# Patient Record
Sex: Male | Born: 1970 | Race: White | Hispanic: No | Smoking: Never smoker
Health system: Southern US, Community
[De-identification: ages and names within clinical notes are randomized; demographics above are authoritative.]

## PROBLEM LIST (undated history)

## (undated) DIAGNOSIS — N183 Chronic kidney disease, stage 3 unspecified: Secondary | ICD-10-CM

## (undated) DIAGNOSIS — M1030 Gout due to renal impairment, unspecified site: Secondary | ICD-10-CM

## (undated) DIAGNOSIS — E669 Obesity, unspecified: Secondary | ICD-10-CM

## (undated) DIAGNOSIS — I10 Essential (primary) hypertension: Secondary | ICD-10-CM

## (undated) DIAGNOSIS — R600 Localized edema: Secondary | ICD-10-CM

## (undated) HISTORY — PX: RENAL BIOPSY: SHX156

---

## 2004-01-20 ENCOUNTER — Ambulatory Visit (HOSPITAL_COMMUNITY): Admission: RE | Admit: 2004-01-20 | Discharge: 2004-01-20 | Payer: Self-pay

## 2004-06-03 ENCOUNTER — Ambulatory Visit: Payer: Self-pay | Admitting: Family Medicine

## 2006-01-11 ENCOUNTER — Ambulatory Visit: Payer: Self-pay | Admitting: Family Medicine

## 2006-01-17 ENCOUNTER — Ambulatory Visit: Payer: Self-pay | Admitting: Family Medicine

## 2006-02-22 ENCOUNTER — Ambulatory Visit: Payer: Self-pay | Admitting: Family Medicine

## 2006-06-16 ENCOUNTER — Ambulatory Visit: Payer: Self-pay | Admitting: Family Medicine

## 2012-02-21 ENCOUNTER — Other Ambulatory Visit: Payer: Self-pay | Admitting: Nephrology

## 2012-02-21 DIAGNOSIS — N059 Unspecified nephritic syndrome with unspecified morphologic changes: Secondary | ICD-10-CM

## 2012-02-22 ENCOUNTER — Ambulatory Visit
Admission: RE | Admit: 2012-02-22 | Discharge: 2012-02-22 | Disposition: A | Discharge status: Home / Self Care | Payer: 59 | Source: Ambulatory Visit | Attending: Nephrology | Admitting: Nephrology

## 2012-02-22 DIAGNOSIS — N059 Unspecified nephritic syndrome with unspecified morphologic changes: Secondary | ICD-10-CM

## 2012-04-02 ENCOUNTER — Other Ambulatory Visit (HOSPITAL_COMMUNITY): Payer: Self-pay | Admitting: Nephrology

## 2012-04-02 ENCOUNTER — Encounter (HOSPITAL_COMMUNITY): Payer: Self-pay | Admitting: Pharmacy Technician

## 2012-04-02 DIAGNOSIS — N183 Chronic kidney disease, stage 3 unspecified: Secondary | ICD-10-CM

## 2012-04-03 ENCOUNTER — Other Ambulatory Visit: Payer: Self-pay | Admitting: Physician Assistant

## 2012-04-09 ENCOUNTER — Ambulatory Visit (HOSPITAL_COMMUNITY)
Admission: RE | Admit: 2012-04-09 | Discharge: 2012-04-09 | Disposition: A | Discharge status: Home / Self Care | Payer: 59 | Source: Ambulatory Visit | Attending: Nephrology | Admitting: Nephrology

## 2012-04-09 ENCOUNTER — Encounter (HOSPITAL_COMMUNITY): Payer: Self-pay

## 2012-04-09 DIAGNOSIS — N183 Chronic kidney disease, stage 3 unspecified: Secondary | ICD-10-CM | POA: Insufficient documentation

## 2012-04-09 HISTORY — DX: Obesity, unspecified: E66.9

## 2012-04-09 HISTORY — DX: Gout due to renal impairment, unspecified site: M10.30

## 2012-04-09 HISTORY — DX: Chronic kidney disease, stage 3 unspecified: N18.30

## 2012-04-09 HISTORY — DX: Localized edema: R60.0

## 2012-04-09 HISTORY — DX: Chronic kidney disease, stage 3 (moderate): N18.3

## 2012-04-09 HISTORY — DX: Essential (primary) hypertension: I10

## 2012-04-09 LAB — CBC
HCT: 40.3 % (ref 39.0–52.0)
Hemoglobin: 13.6 g/dL (ref 13.0–17.0)
MCH: 30 pg (ref 26.0–34.0)
MCHC: 33.7 g/dL (ref 30.0–36.0)
MCV: 88.8 fL (ref 78.0–100.0)
Platelets: 233 10*3/uL (ref 150–400)
RBC: 4.54 MIL/uL (ref 4.22–5.81)
RDW: 12.4 % (ref 11.5–15.5)
WBC: 6.9 10*3/uL (ref 4.0–10.5)

## 2012-04-09 LAB — APTT: aPTT: 30 seconds (ref 24–37)

## 2012-04-09 LAB — PROTIME-INR
INR: 0.94 (ref 0.00–1.49)
Prothrombin Time: 12.5 seconds (ref 11.6–15.2)

## 2012-04-09 MED ORDER — FENTANYL CITRATE 0.05 MG/ML IJ SOLN
INTRAMUSCULAR | Status: AC
  Filled 2012-04-09: qty 4

## 2012-04-09 MED ORDER — SODIUM CHLORIDE 0.9 % IV SOLN
INTRAVENOUS | Status: DC
  Administered 2012-04-09: 09:00:00 via INTRAVENOUS

## 2012-04-09 MED ORDER — MIDAZOLAM HCL 2 MG/2ML IJ SOLN
INTRAMUSCULAR | Status: AC
  Filled 2012-04-09: qty 4

## 2012-04-09 MED ORDER — FENTANYL CITRATE 0.05 MG/ML IJ SOLN
INTRAMUSCULAR | Status: AC | PRN
  Administered 2012-04-09 (×2): 25 ug via INTRAVENOUS
  Administered 2012-04-09: 50 ug via INTRAVENOUS

## 2012-04-09 MED ORDER — MIDAZOLAM HCL 2 MG/2ML IJ SOLN
INTRAMUSCULAR | Status: AC | PRN
  Administered 2012-04-09 (×2): 1 mg via INTRAVENOUS
  Administered 2012-04-09: 2 mg via INTRAVENOUS

## 2012-04-09 NOTE — ED Notes (Signed)
REQUEST BED FROM SHORT STAY

## 2012-04-09 NOTE — Procedures (Signed)
US guided renal biopsy.  2 cores obtained from right kidney lower pole.  No immediate complication.

## 2012-04-09 NOTE — H&P (Signed)
Christopher Boyer is an 41 y.o. male.   Chief Complaint:  Presents today for random needle core renal biopsy.  HPI: Stage 3 kidney disease with microscopic hematuria, proteinuria, hypertension and LE edema. Presents for biopsy to establish diagnosis.   Past Medical History  Diagnosis Date  . Hypertension   . Chronic kidney disease (CKD), stage III (moderate)   . Obesity   . Leg edema   . Gout due to renal impairment     No past surgical history on file. Patient denies any prior surgical procedures.   Social History:  does not have a smoking history on file. He does not have any smokeless tobacco history on file. His alcohol and drug histories not on file.  Allergies:  Allergies  Allergen Reactions  . Septra (Sulfamethoxazole W-Trimethoprim) Rash     Medication List     As of 04/09/2012 10:33 AM    ASK your doctor about these medications         febuxostat 40 MG tablet   Commonly known as: ULORIC   Take 40 mg by mouth daily.      furosemide 40 MG tablet   Commonly known as: LASIX   Take 40 mg by mouth daily.      metoprolol succinate 100 MG 24 hr tablet   Commonly known as: TOPROL-XL   Take 100 mg by mouth daily. Take with or immediately following a meal.      omeprazole 20 MG capsule   Commonly known as: PRILOSEC   Take 20 mg by mouth daily.          Results for orders placed during the hospital encounter of 04/09/12 (from the past 48 hour(s))  APTT     Status: Normal   Collection Time   04/09/12  8:57 AM      Component Value Range Comment   aPTT 30  24 - 37 seconds   CBC     Status: Normal   Collection Time   04/09/12  8:57 AM      Component Value Range Comment   WBC 6.9  4.0 - 10.5 K/uL    RBC 4.54  4.22 - 5.81 MIL/uL    Hemoglobin 13.6  13.0 - 17.0 g/dL    HCT 40.3  39.0 - 52.0 %    MCV 88.8  78.0 - 100.0 fL    MCH 30.0  26.0 - 34.0 pg    MCHC 33.7  30.0 - 36.0 g/dL    RDW 12.4  11.5 - 15.5 %    Platelets 233  150 - 400 K/uL   PROTIME-INR      Status: Normal   Collection Time   04/09/12  8:57 AM      Component Value Range Comment   Prothrombin Time 12.5  11.6 - 15.2 seconds    INR 0.94  0.00 - 1.49     Review of Systems  Constitutional: Positive for weight loss. Negative for fever and chills.  Eyes: Negative.   Respiratory: Negative for cough, hemoptysis, sputum production and shortness of breath.   Cardiovascular: Positive for orthopnea and leg swelling. Negative for chest pain, palpitations and claudication.  Gastrointestinal: Positive for heartburn.  Genitourinary: Positive for hematuria.       Microscopic hematuria and proteinuria   Skin: Negative.   Neurological: Positive for headaches. Negative for weakness.  Endo/Heme/Allergies: Negative.   Psychiatric/Behavioral: Negative.     Blood pressure 140/90, pulse 91, temperature 99.2 F (37.3 C), temperature source Oral,  resp. rate 16, height 6' (1.829 m), weight 350 lb (158.759 kg), SpO2 100.00%. Physical Exam  Constitutional: He is oriented to person, place, and time. He appears well-developed and well-nourished. No distress.  Cardiovascular: Normal rate and normal heart sounds.  Exam reveals no gallop and no friction rub.   No murmur heard. Respiratory: Effort normal and breath sounds normal. No respiratory distress. He has no wheezes. He has no rales.  GI: Soft. Bowel sounds are normal. He exhibits no distension.       Obese   Musculoskeletal: He exhibits no edema.  Neurological: He is alert and oriented to person, place, and time.  Skin: Skin is warm and dry.  Psychiatric: He has a normal mood and affect. His behavior is normal. Judgment and thought content normal.     Assessment/Plan Procedure details for random renal needle core biopsy discussed in detail with patient with his apparent understanding. Potential complications including but not limited to infection, bleeding, organ damage, inadequate sampling and complications with moderate sedation reviewed.  Labs have been reviewed and are appropriate to proceed with needle biopsy. Written consent obtained.   [name] 04/09/2012, 10:33 AM

## 2014-04-21 IMAGING — US US RENAL
1 series · 14 of 25 positions shown · non-contrast
Comparison: None.

CLINICAL DATA: Nephritis, on medication for hypertension

RENAL/URINARY TRACT ULTRASOUND COMPLETE

[Series 1: us renal · 0.33mm/px · 14 of 28 slices shown]
[im 1/28]
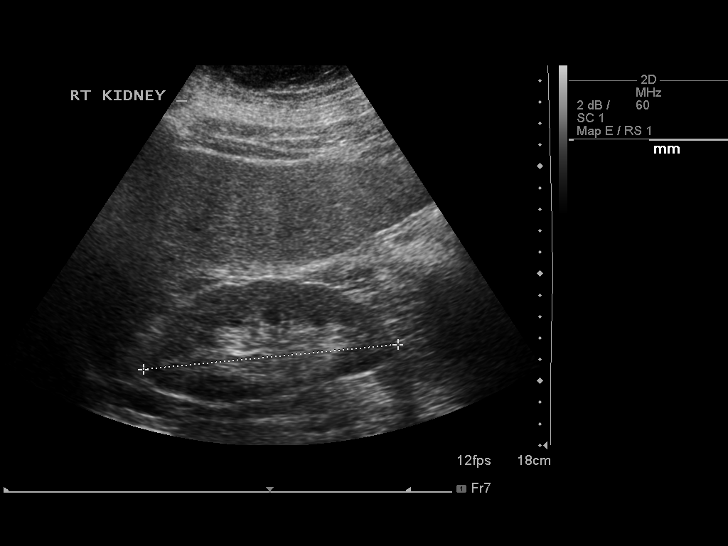
[im 3/28]
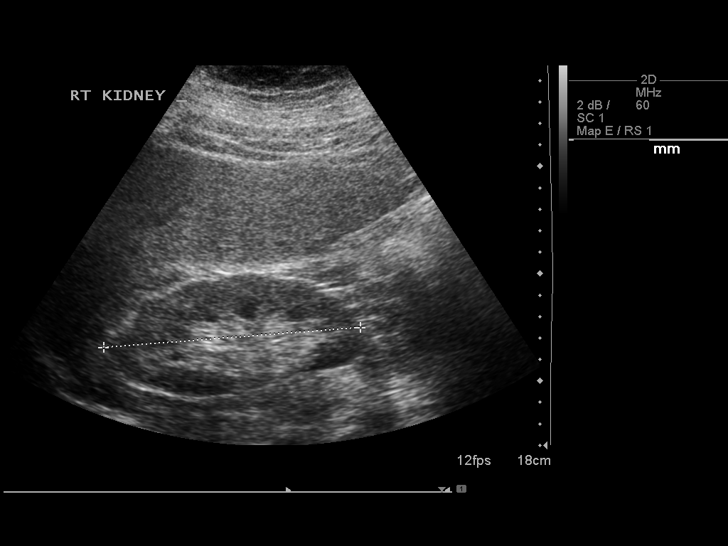
[im 5/28]
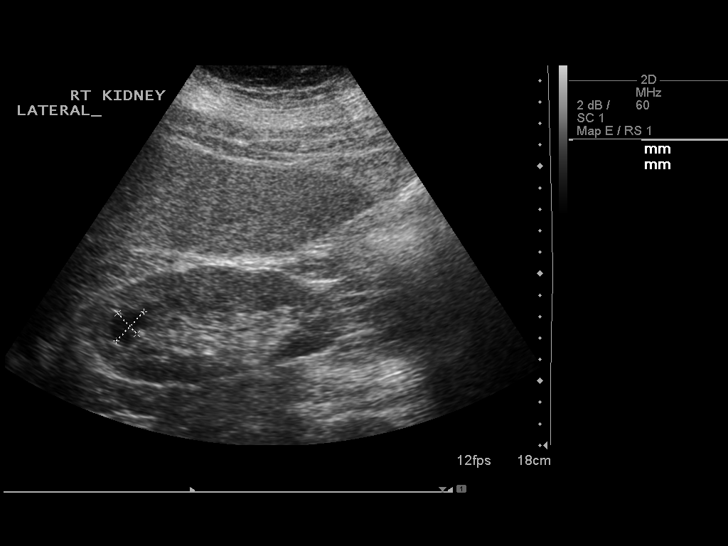
[im 7/28]
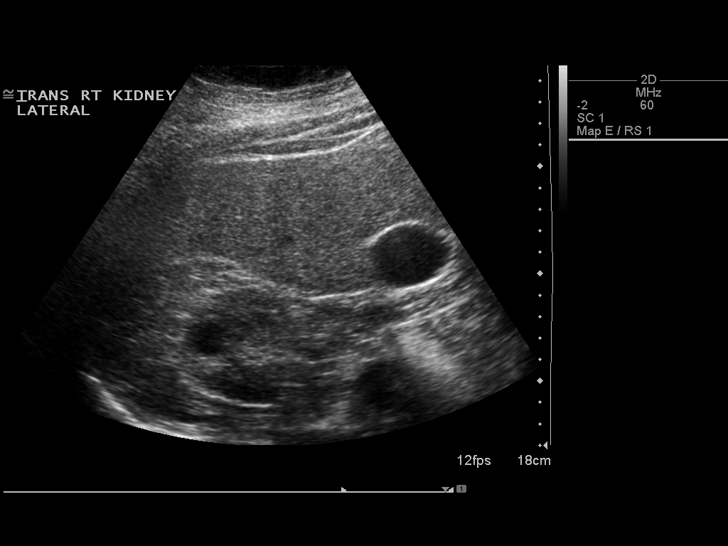
[im 10/28]
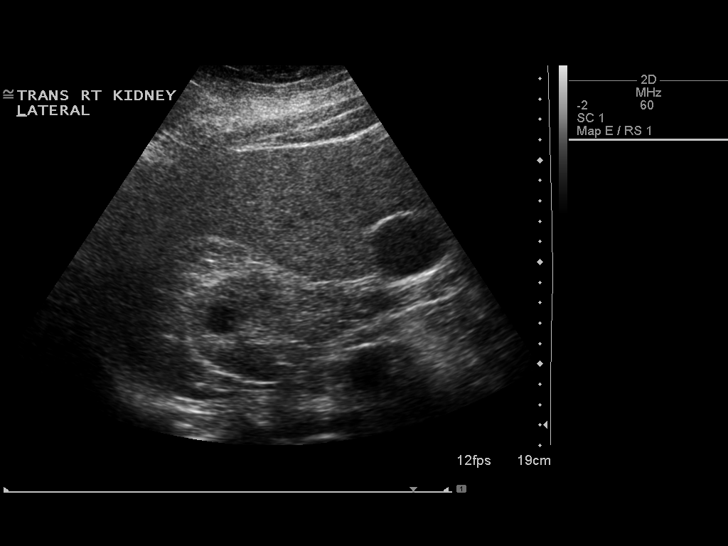
[im 11/28]
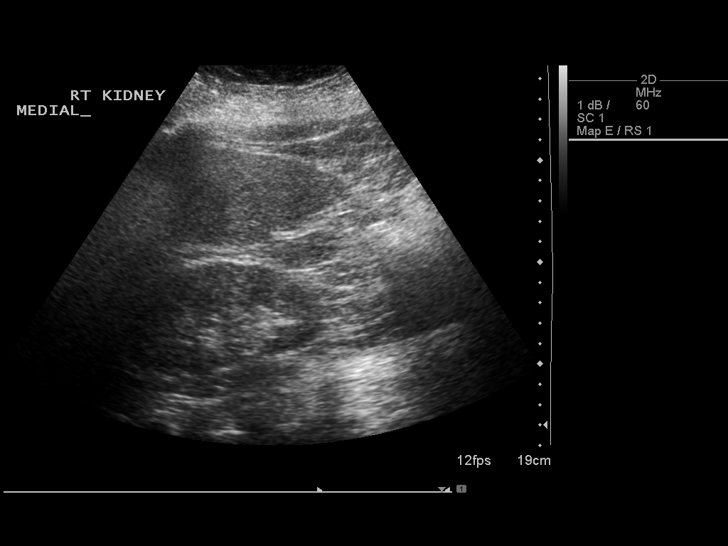
[im 13/28]
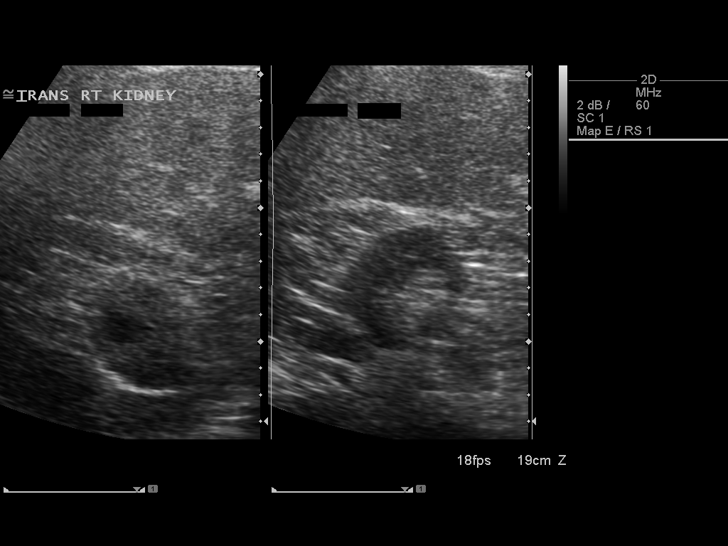
[im 15/28]
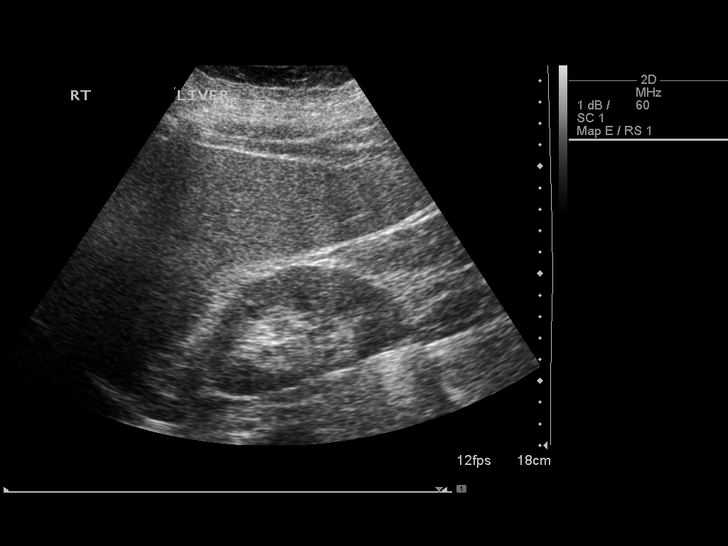
[im 17/28]
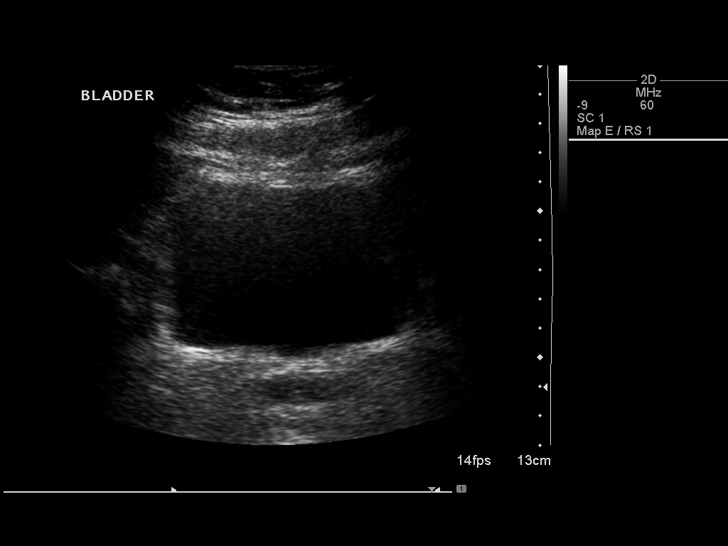
[im 19/28]
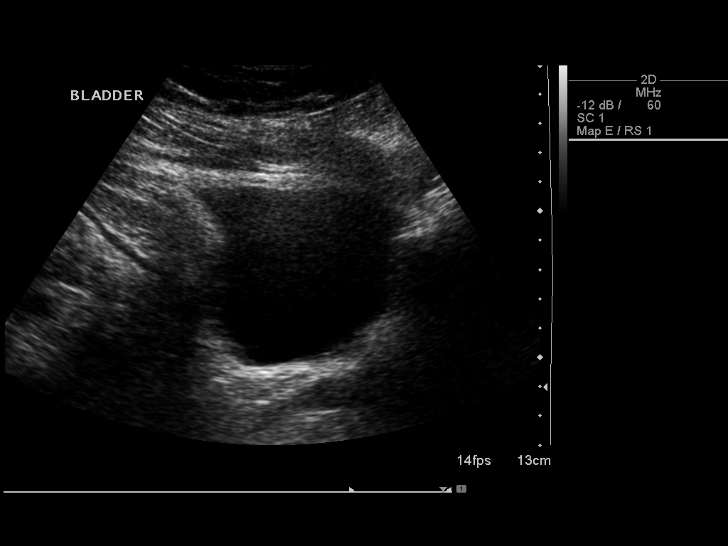
[im 21/28]
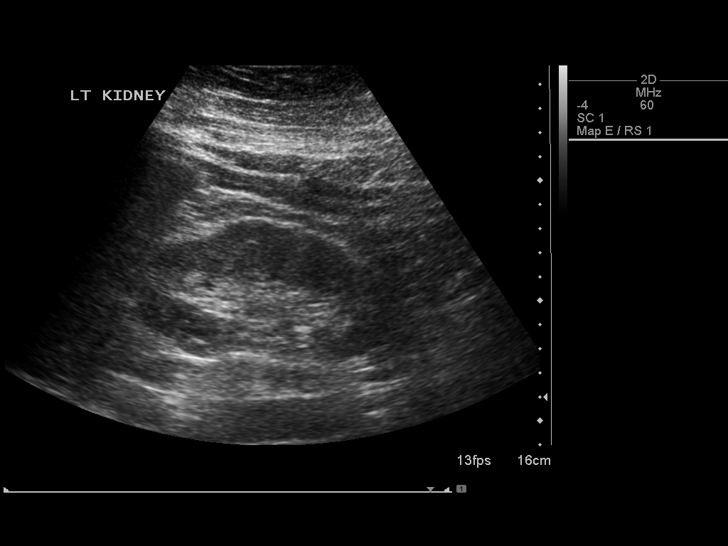
[im 23/28]
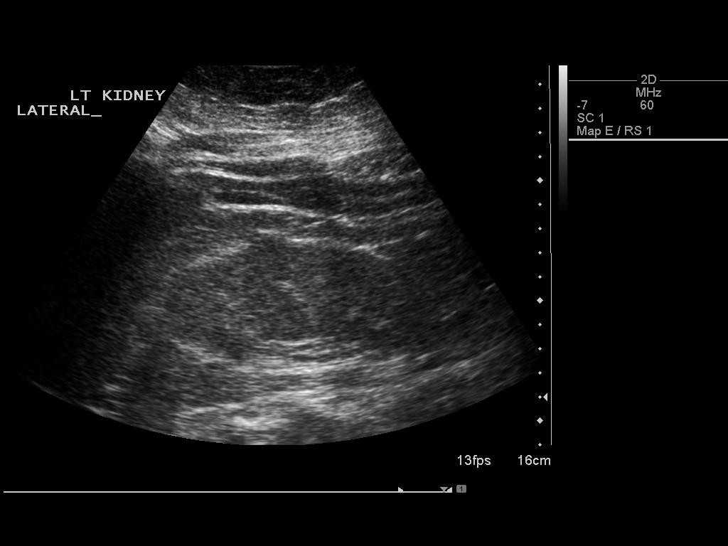
[im 25/28]
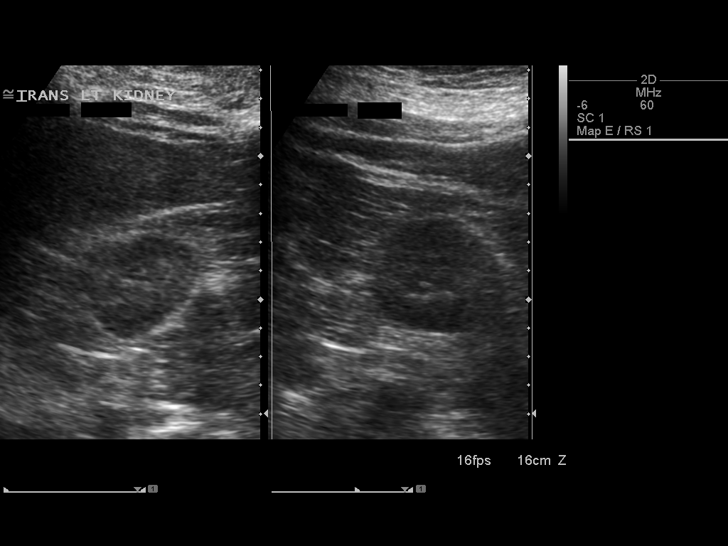
[im 28/28]
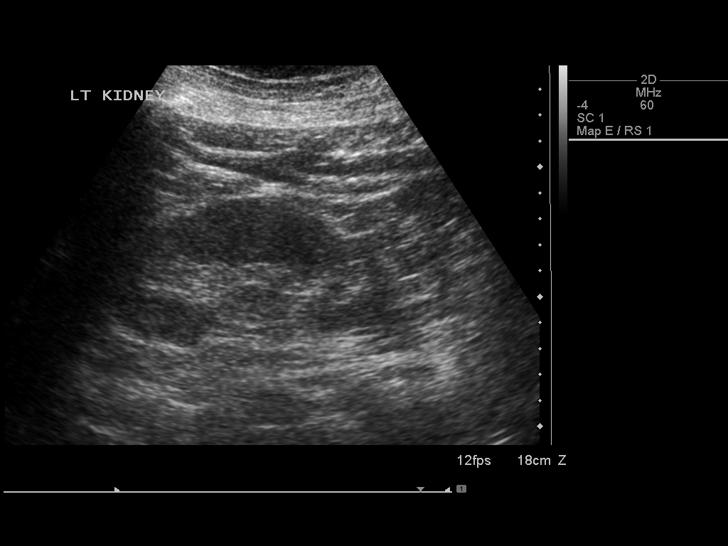

[14 of 25 positions shown; findings below may reference images not displayed]

FINDINGS: Right Kidney:  No hydronephrosis is seen.  The right kidney
measures 12.0 cm sagittally.  There is a small cyst in the upper
pole of 1.9 cm in maximum diameter.

Left Kidney:  No hydronephrosis is noted.  The left kidney measures
11.6 cm.

Bladder:  The urinary bladder is moderately urine distended with no
abnormality noted.
IMPRESSION: No hydronephrosis.  No abnormality of urinary bladder.

## 2014-06-07 IMAGING — US US BIOPSY
1 series · 8 of 8 positions shown · non-contrast
Comparison: none

CLINICAL HISTORY: 41-year-old with chronic kidney disease.

[Series 1: us biopsy · 0.32mm/px · 8 of 8 slices shown]
[im 1/8]
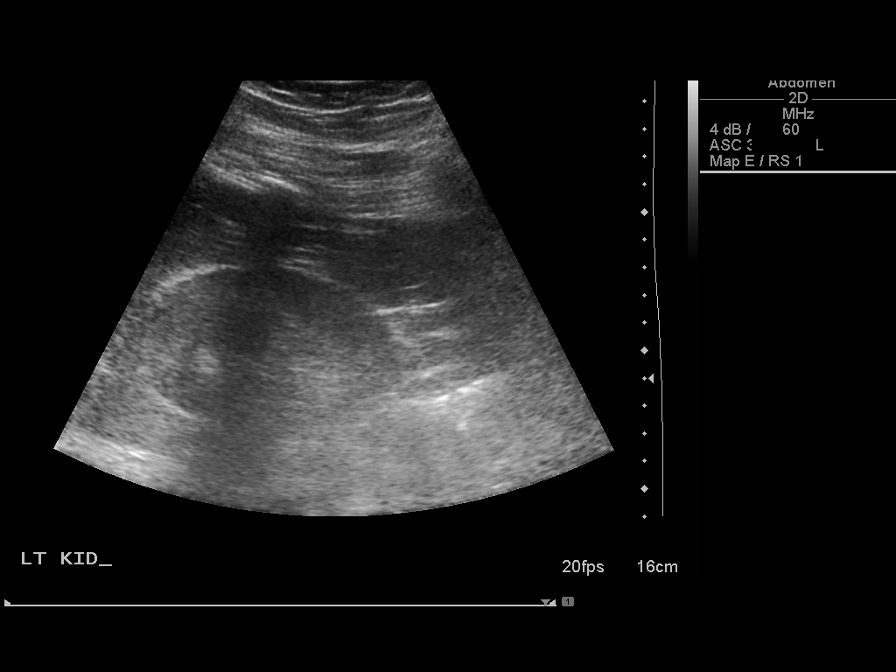
[im 2/8]
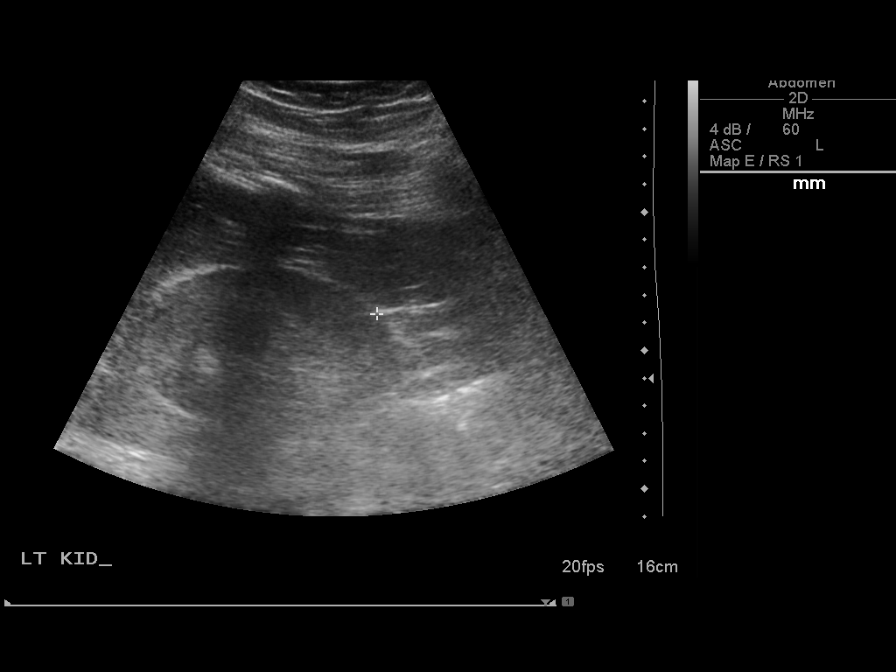
[im 3/8]
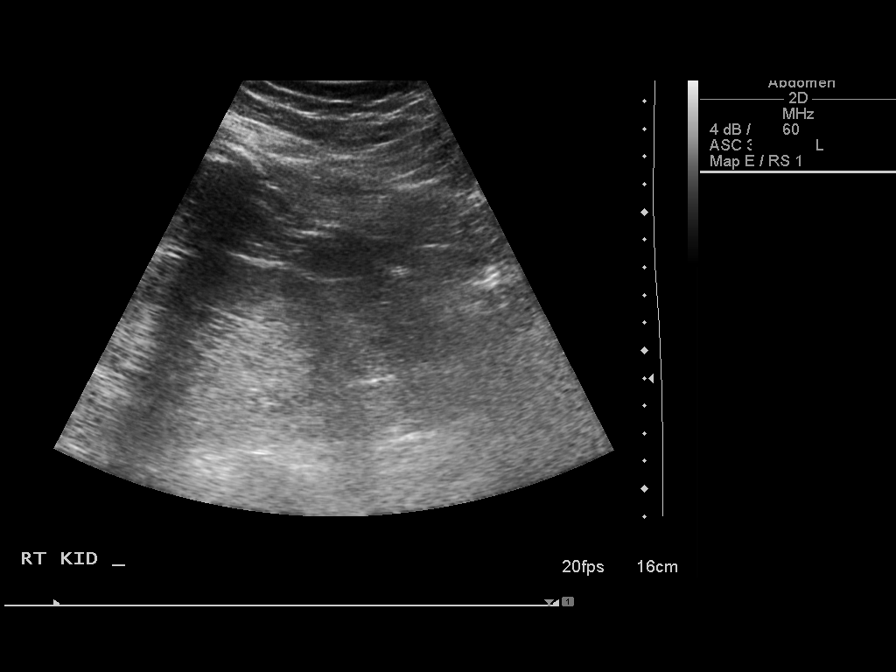
[im 4/8]
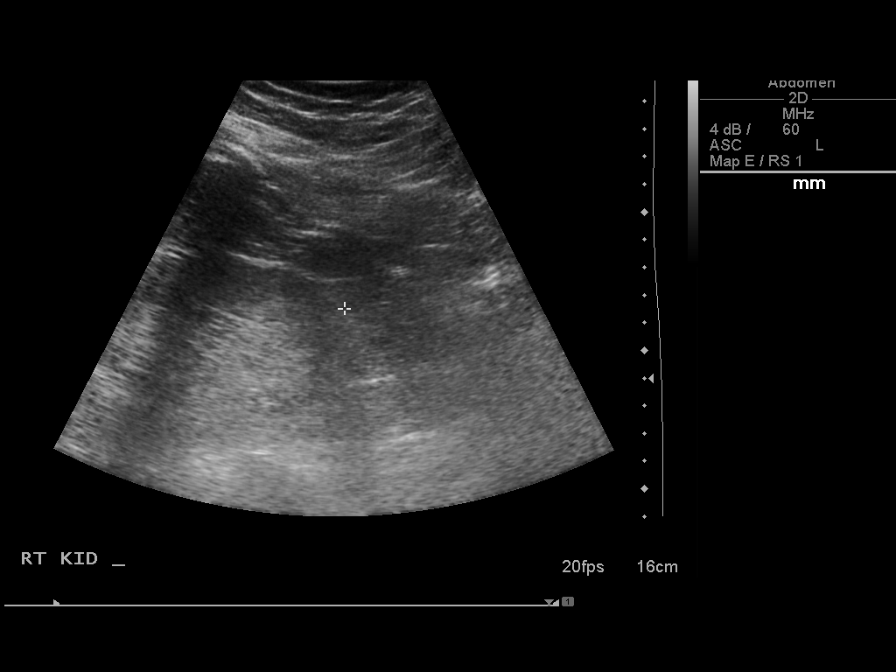
[im 5/8]
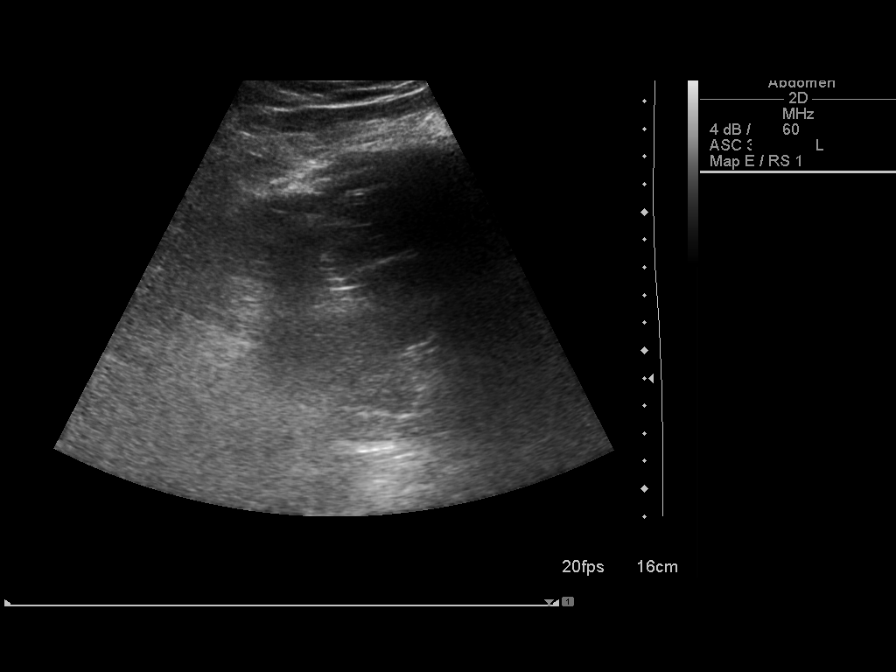
[im 6/8]
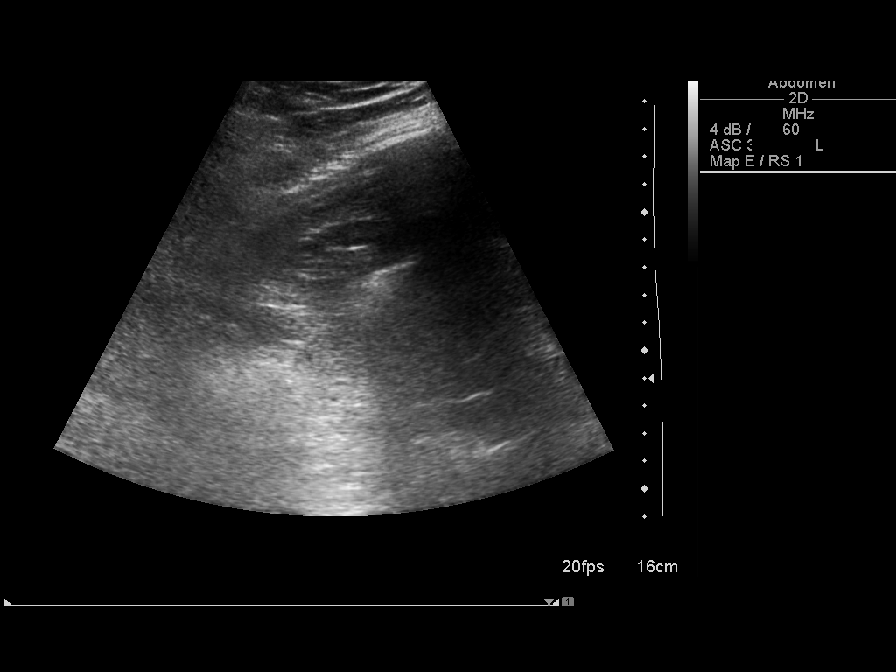
[im 7/8]
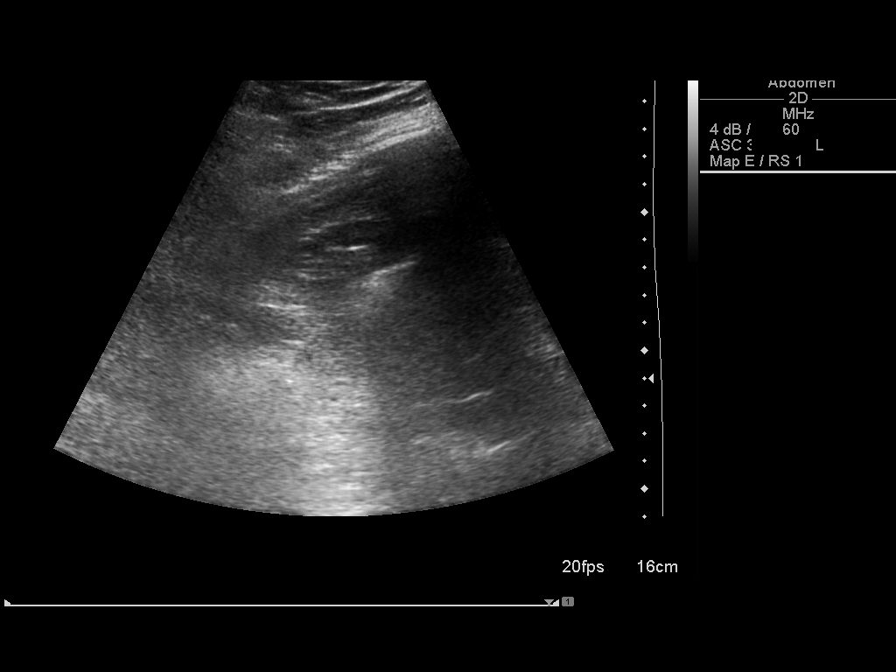
[im 8/8]
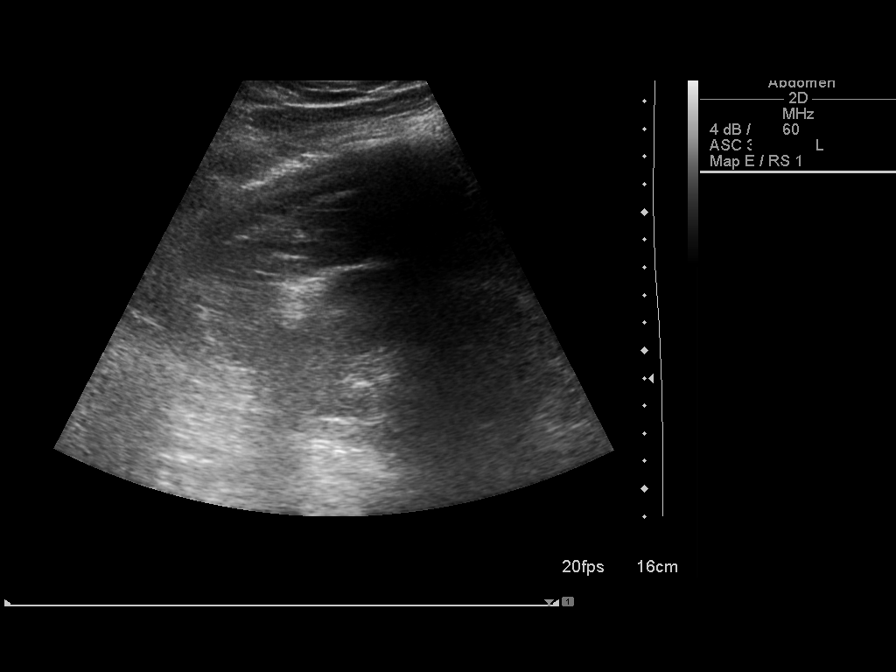

[8 of 8 positions shown; findings below may reference images not displayed]

PROCEDURE(S): ULTRASOUND GUIDED RIGHT KIDNEY BIOPSY

Medications:Versed 4 mg, Fentanyl 100 mcg. A radiology nurse
monitored the patient for moderate sedation.

Moderate sedation time:19 minutes

Fluoroscopy time: None

Procedure:The procedure was explained to the patient.  The risks
and benefits of the procedure were discussed and the patient's
questions were addressed.  Informed consent was obtained from the
patient.  The patient was placed prone and both kidneys were
evaluated with ultrasound.  The right kidney was selected for
percutaneous biopsy.  The right flank was prepped and draped in a
sterile fashion.  The skin was anesthetized with lidocaine.  A 15
gauge needle was directed towards the lower pole of the right
kidney and placed near the cortex.  Two core biopsies were obtained
from the right kidney cortex with a 16 gauge device.  Samples were
placed in saline.
FINDINGS: No evidence for hydronephrosis in either kidney.  Biopsy
needle placement confirmed within the right kidney lower pole.

Complications: None
IMPRESSION: Ultrasound guided biopsies of the right kidney.

## 2015-12-08 DIAGNOSIS — M109 Gout, unspecified: Secondary | ICD-10-CM | POA: Insufficient documentation

## 2019-08-23 DIAGNOSIS — I4891 Unspecified atrial fibrillation: Secondary | ICD-10-CM

## 2019-08-23 HISTORY — DX: Unspecified atrial fibrillation: I48.91

## 2019-12-20 ENCOUNTER — Other Ambulatory Visit: Payer: Self-pay

## 2019-12-20 ENCOUNTER — Emergency Department (HOSPITAL_COMMUNITY): Payer: 59

## 2019-12-20 ENCOUNTER — Encounter (HOSPITAL_COMMUNITY): Payer: Self-pay | Admitting: Internal Medicine

## 2019-12-20 ENCOUNTER — Inpatient Hospital Stay (HOSPITAL_COMMUNITY)
Admission: EM | Admit: 2019-12-20 | Discharge: 2019-12-21 | DRG: 871 | Disposition: A | Discharge status: Home / Self Care | Payer: 59 | Attending: Internal Medicine | Admitting: Internal Medicine

## 2019-12-20 DIAGNOSIS — Z9981 Dependence on supplemental oxygen: Secondary | ICD-10-CM | POA: Diagnosis not present

## 2019-12-20 DIAGNOSIS — U071 COVID-19: Secondary | ICD-10-CM

## 2019-12-20 DIAGNOSIS — N179 Acute kidney failure, unspecified: Secondary | ICD-10-CM

## 2019-12-20 DIAGNOSIS — Z79899 Other long term (current) drug therapy: Secondary | ICD-10-CM

## 2019-12-20 DIAGNOSIS — M1030 Gout due to renal impairment, unspecified site: Secondary | ICD-10-CM | POA: Diagnosis present

## 2019-12-20 DIAGNOSIS — I129 Hypertensive chronic kidney disease with stage 1 through stage 4 chronic kidney disease, or unspecified chronic kidney disease: Secondary | ICD-10-CM | POA: Diagnosis present

## 2019-12-20 DIAGNOSIS — E869 Volume depletion, unspecified: Secondary | ICD-10-CM | POA: Diagnosis present

## 2019-12-20 DIAGNOSIS — E785 Hyperlipidemia, unspecified: Secondary | ICD-10-CM | POA: Diagnosis present

## 2019-12-20 DIAGNOSIS — K219 Gastro-esophageal reflux disease without esophagitis: Secondary | ICD-10-CM | POA: Diagnosis present

## 2019-12-20 DIAGNOSIS — J1282 Pneumonia due to coronavirus disease 2019: Secondary | ICD-10-CM | POA: Diagnosis present

## 2019-12-20 DIAGNOSIS — I4892 Unspecified atrial flutter: Secondary | ICD-10-CM | POA: Diagnosis present

## 2019-12-20 DIAGNOSIS — Z6841 Body Mass Index (BMI) 40.0 and over, adult: Secondary | ICD-10-CM | POA: Diagnosis not present

## 2019-12-20 DIAGNOSIS — M791 Myalgia, unspecified site: Secondary | ICD-10-CM | POA: Diagnosis present

## 2019-12-20 DIAGNOSIS — F329 Major depressive disorder, single episode, unspecified: Secondary | ICD-10-CM | POA: Diagnosis present

## 2019-12-20 DIAGNOSIS — Z882 Allergy status to sulfonamides status: Secondary | ICD-10-CM

## 2019-12-20 DIAGNOSIS — F411 Generalized anxiety disorder: Secondary | ICD-10-CM | POA: Diagnosis present

## 2019-12-20 DIAGNOSIS — N184 Chronic kidney disease, stage 4 (severe): Secondary | ICD-10-CM | POA: Diagnosis present

## 2019-12-20 DIAGNOSIS — J9600 Acute respiratory failure, unspecified whether with hypoxia or hypercapnia: Secondary | ICD-10-CM | POA: Diagnosis not present

## 2019-12-20 DIAGNOSIS — A419 Sepsis, unspecified organism: Secondary | ICD-10-CM | POA: Diagnosis not present

## 2019-12-20 DIAGNOSIS — A0839 Other viral enteritis: Secondary | ICD-10-CM | POA: Diagnosis present

## 2019-12-20 DIAGNOSIS — A4189 Other specified sepsis: Secondary | ICD-10-CM | POA: Diagnosis present

## 2019-12-20 DIAGNOSIS — R0602 Shortness of breath: Secondary | ICD-10-CM

## 2019-12-20 DIAGNOSIS — E559 Vitamin D deficiency, unspecified: Secondary | ICD-10-CM | POA: Diagnosis present

## 2019-12-20 DIAGNOSIS — Z20822 Contact with and (suspected) exposure to covid-19: Secondary | ICD-10-CM

## 2019-12-20 DIAGNOSIS — J9601 Acute respiratory failure with hypoxia: Secondary | ICD-10-CM | POA: Diagnosis present

## 2019-12-20 DIAGNOSIS — I4891 Unspecified atrial fibrillation: Secondary | ICD-10-CM | POA: Diagnosis present

## 2019-12-20 LAB — TRIGLYCERIDES: Triglycerides: 200 mg/dL — ABNORMAL HIGH (ref <150)

## 2019-12-20 LAB — CBC WITH DIFFERENTIAL/PLATELET
Abs Immature Granulocytes: 0.03 10*3/uL (ref 0.00–0.07)
Basophils Absolute: 0 10*3/uL (ref 0.0–0.1)
Basophils Relative: 1 %
Eosinophils Absolute: 0 10*3/uL (ref 0.0–0.5)
Eosinophils Relative: 0 %
HCT: 41.5 % (ref 39.0–52.0)
Hemoglobin: 13.4 g/dL (ref 13.0–17.0)
Immature Granulocytes: 1 %
Lymphocytes Relative: 17 %
Lymphs Abs: 0.5 10*3/uL — ABNORMAL LOW (ref 0.7–4.0)
MCH: 29 pg (ref 26.0–34.0)
MCHC: 32.3 g/dL (ref 30.0–36.0)
MCV: 89.8 fL (ref 80.0–100.0)
Monocytes Absolute: 0.2 10*3/uL (ref 0.1–1.0)
Monocytes Relative: 7 %
Neutro Abs: 2.3 10*3/uL (ref 1.7–7.7)
Neutrophils Relative %: 74 %
Platelets: 135 10*3/uL — ABNORMAL LOW (ref 150–400)
RBC: 4.62 MIL/uL (ref 4.22–5.81)
RDW: 13.5 % (ref 11.5–15.5)
WBC: 3.1 10*3/uL — ABNORMAL LOW (ref 4.0–10.5)
nRBC: 0 % (ref 0.0–0.2)

## 2019-12-20 LAB — BASIC METABOLIC PANEL
Anion gap: 11 (ref 5–15)
BUN: 49 mg/dL — ABNORMAL HIGH (ref 6–20)
CO2: 19 mmol/L — ABNORMAL LOW (ref 22–32)
Calcium: 8.7 mg/dL — ABNORMAL LOW (ref 8.9–10.3)
Chloride: 103 mmol/L (ref 98–111)
Creatinine, Ser: 4.66 mg/dL — ABNORMAL HIGH (ref 0.61–1.24)
GFR calc Af Amer: 16 mL/min — ABNORMAL LOW (ref >60)
GFR calc non Af Amer: 14 mL/min — ABNORMAL LOW (ref >60)
Glucose, Bld: 117 mg/dL — ABNORMAL HIGH (ref 70–99)
Potassium: 4.3 mmol/L (ref 3.5–5.1)
Sodium: 133 mmol/L — ABNORMAL LOW (ref 135–145)

## 2019-12-20 LAB — HEPATIC FUNCTION PANEL
ALT: 42 U/L (ref 0–44)
AST: 53 U/L — ABNORMAL HIGH (ref 15–41)
Albumin: 3.6 g/dL (ref 3.5–5.0)
Alkaline Phosphatase: 37 U/L — ABNORMAL LOW (ref 38–126)
Bilirubin, Direct: 0.2 mg/dL (ref 0.0–0.2)
Indirect Bilirubin: 0.4 mg/dL (ref 0.3–0.9)
Total Bilirubin: 0.6 mg/dL (ref 0.3–1.2)
Total Protein: 6.6 g/dL (ref 6.5–8.1)

## 2019-12-20 LAB — C-REACTIVE PROTEIN: CRP: 5.7 mg/dL — ABNORMAL HIGH (ref <1.0)

## 2019-12-20 LAB — SARS CORONAVIRUS 2 BY RT PCR (HOSPITAL ORDER, PERFORMED IN ~~LOC~~ HOSPITAL LAB): SARS Coronavirus 2: POSITIVE — AB

## 2019-12-20 LAB — LACTATE DEHYDROGENASE: LDH: 203 U/L — ABNORMAL HIGH (ref 98–192)

## 2019-12-20 LAB — FERRITIN: Ferritin: 904 ng/mL — ABNORMAL HIGH (ref 24–336)

## 2019-12-20 LAB — C DIFFICILE QUICK SCREEN W PCR REFLEX
C Diff antigen: NEGATIVE
C Diff interpretation: NOT DETECTED
C Diff toxin: NEGATIVE

## 2019-12-20 LAB — ACETAMINOPHEN LEVEL: Acetaminophen (Tylenol), Serum: <10 ug/mL — ABNORMAL LOW (ref 10–30)

## 2019-12-20 LAB — LACTIC ACID, PLASMA: Lactic Acid, Venous: 1 mmol/L (ref 0.5–1.9)

## 2019-12-20 LAB — ABO/RH: ABO/RH(D): B POS

## 2019-12-20 LAB — HIV ANTIBODY (ROUTINE TESTING W REFLEX): HIV Screen 4th Generation wRfx: NONREACTIVE

## 2019-12-20 LAB — PROCALCITONIN: Procalcitonin: 0.56 ng/mL

## 2019-12-20 LAB — FIBRINOGEN: Fibrinogen: 444 mg/dL (ref 210–475)

## 2019-12-20 LAB — D-DIMER, QUANTITATIVE: D-Dimer, Quant: 0.47 ug/mL-FEU (ref 0.00–0.50)

## 2019-12-20 MED ORDER — HEPARIN SODIUM (PORCINE) 5000 UNIT/ML IJ SOLN
5000.0000 [IU] | Freq: Three times a day (TID) | INTRAMUSCULAR | Status: DC
  Administered 2019-12-20 – 2019-12-21 (×2): 5000 [IU] via SUBCUTANEOUS
  Filled 2019-12-20 (×2): qty 1

## 2019-12-20 MED ORDER — ZINC SULFATE 220 (50 ZN) MG PO CAPS
220.0000 mg | ORAL_CAPSULE | Freq: Every day | ORAL | Status: DC
  Administered 2019-12-20 – 2019-12-21 (×2): 220 mg via ORAL
  Filled 2019-12-20 (×2): qty 1

## 2019-12-20 MED ORDER — ALPRAZOLAM 0.5 MG PO TABS: 0.5000 mg | ORAL_TABLET | Freq: Three times a day (TID) | ORAL | Status: DC | PRN

## 2019-12-20 MED ORDER — ONDANSETRON HCL 4 MG PO TABS: 4.0000 mg | ORAL_TABLET | Freq: Four times a day (QID) | ORAL | Status: DC | PRN

## 2019-12-20 MED ORDER — METOPROLOL SUCCINATE ER 25 MG PO TB24
25.0000 mg | ORAL_TABLET | Freq: Every day | ORAL | Status: DC
  Administered 2019-12-20 – 2019-12-21 (×2): 25 mg via ORAL
  Filled 2019-12-20 (×2): qty 1

## 2019-12-20 MED ORDER — ASCORBIC ACID 500 MG PO TABS
500.0000 mg | ORAL_TABLET | Freq: Every day | ORAL | Status: DC
  Administered 2019-12-20 – 2019-12-21 (×2): 500 mg via ORAL
  Filled 2019-12-20 (×2): qty 1

## 2019-12-20 MED ORDER — SODIUM CHLORIDE 0.9 % IV SOLN: 200.0000 mg | Freq: Once | INTRAVENOUS | Status: DC

## 2019-12-20 MED ORDER — SODIUM CHLORIDE 0.9 % IV SOLN
500.0000 mg | INTRAVENOUS | Status: DC
  Administered 2019-12-20 – 2019-12-21 (×2): 500 mg via INTRAVENOUS
  Filled 2019-12-20 (×2): qty 500

## 2019-12-20 MED ORDER — METHYLPREDNISOLONE SODIUM SUCC 125 MG IJ SOLR
0.5000 mg/kg | Freq: Two times a day (BID) | INTRAMUSCULAR | Status: DC
  Administered 2019-12-20 – 2019-12-21 (×3): 79.375 mg via INTRAVENOUS
  Filled 2019-12-20 (×3): qty 2

## 2019-12-20 MED ORDER — SODIUM CHLORIDE 0.9 % IV SOLN
100.0000 mg | INTRAVENOUS | Status: AC
  Administered 2019-12-20 (×2): 100 mg via INTRAVENOUS
  Filled 2019-12-20 (×2): qty 20

## 2019-12-20 MED ORDER — ACETAMINOPHEN 325 MG PO TABS
650.0000 mg | ORAL_TABLET | Freq: Once | ORAL | Status: AC
  Administered 2019-12-20: 650 mg via ORAL
  Filled 2019-12-20: qty 2

## 2019-12-20 MED ORDER — HEPARIN SODIUM (PORCINE) 5000 UNIT/ML IJ SOLN
5000.0000 [IU] | Freq: Three times a day (TID) | INTRAMUSCULAR | Status: DC
  Administered 2019-12-20: 5000 [IU] via SUBCUTANEOUS
  Filled 2019-12-20: qty 1

## 2019-12-20 MED ORDER — FEBUXOSTAT 40 MG PO TABS
40.0000 mg | ORAL_TABLET | Freq: Every day | ORAL | Status: DC
  Filled 2019-12-20 (×5): qty 1

## 2019-12-20 MED ORDER — SODIUM CHLORIDE 0.9 % IV SOLN
100.0000 mg | Freq: Every day | INTRAVENOUS | Status: DC
  Administered 2019-12-21: 100 mg via INTRAVENOUS
  Filled 2019-12-20: qty 20

## 2019-12-20 MED ORDER — SODIUM CHLORIDE 0.9 % IV SOLN: 100.0000 mg | Freq: Every day | INTRAVENOUS | Status: DC

## 2019-12-20 MED ORDER — SODIUM CHLORIDE 0.9 % IV BOLUS
500.0000 mL | Freq: Once | INTRAVENOUS | Status: AC
  Administered 2019-12-20: 500 mL via INTRAVENOUS

## 2019-12-20 MED ORDER — DEXAMETHASONE SODIUM PHOSPHATE 10 MG/ML IJ SOLN
10.0000 mg | Freq: Once | INTRAMUSCULAR | Status: AC
  Administered 2019-12-20: 10 mg via INTRAVENOUS
  Filled 2019-12-20: qty 1

## 2019-12-20 MED ORDER — SODIUM CHLORIDE 0.9 % IV SOLN
1.0000 g | INTRAVENOUS | Status: DC
  Administered 2019-12-20 – 2019-12-21 (×2): 1 g via INTRAVENOUS
  Filled 2019-12-20 (×2): qty 10

## 2019-12-20 MED ORDER — SODIUM CHLORIDE 0.9 % IV SOLN: INTRAVENOUS | Status: AC

## 2019-12-20 MED ORDER — ONDANSETRON HCL 4 MG/2ML IJ SOLN: 4.0000 mg | Freq: Four times a day (QID) | INTRAMUSCULAR | Status: DC | PRN

## 2019-12-20 MED ORDER — BUPROPION HCL ER (XL) 300 MG PO TB24
300.0000 mg | ORAL_TABLET | Freq: Every day | ORAL | Status: DC
  Administered 2019-12-20 – 2019-12-21 (×2): 300 mg via ORAL
  Filled 2019-12-20: qty 2
  Filled 2019-12-20: qty 1

## 2019-12-20 MED ORDER — PANTOPRAZOLE SODIUM 40 MG PO TBEC
40.0000 mg | DELAYED_RELEASE_TABLET | Freq: Every day | ORAL | Status: DC
  Administered 2019-12-20 – 2019-12-21 (×2): 40 mg via ORAL
  Filled 2019-12-20 (×2): qty 1

## 2019-12-20 NOTE — ED Provider Notes (Addendum)
[facility] EMERGENCY DEPARTMENT Provider Note   CSN: [id number] Arrival date & time: 12/20/19  0206     History Chief Complaint  Patient presents with  . Covid Exposure    Christopher Boyer is a 49 y.o. male.  Patient with history of obesity, hypertension, CKD here with 8 days of "Covid symptoms".  States he has been having body aches, chills, cough, fever, headache, sore throat, shortness of breath.  Did have a positive home test approximately 5 days ago.  Coworkers have been sick as well.  He comes in tonight because he is concerned he is not improving and feels like his infection is getting worse.  He is having more coughing and more difficulty breathing and shortness of breath.  His cough is productive of green and yellow mucus.  He denies any vomiting but has had a few episodes of diarrhea.  The abdomen is sore and chest is sore from coughing.  Planes of headache and body aches as well.  He has been taking 1 g of Tylenol every 4 hours at home for approximately the past 1 week.  He is concerned he is been taking too much Tylenol but states he cannot take ibuprofen because of his kidney function.  His last dose of Tylenol was approximately 4 PM yesterday.  The history is provided by the patient.       Past Medical History:  Diagnosis Date  . Chronic kidney disease (CKD), stage III (moderate)   . Gout due to renal impairment   . Hypertension   . Leg edema   . Obesity     There are no problems to display for this patient.   * The histories are not reviewed yet. Please review them in the "History" navigator section and refresh this Sun.     No family history on file.  Social History   Tobacco Use  . Smoking status: Not on file  Substance Use Topics  . Alcohol use: Not on file  . Drug use: Not on file    Home Medications Prior to Admission medications   Medication Sig Start Date End Date Taking? Authorizing Provider  febuxostat (ULORIC) 40 MG tablet Take 40  mg by mouth daily.     [provider]  furosemide (LASIX) 40 MG tablet Take 40 mg by mouth daily.    [provider]  metoprolol succinate (TOPROL-XL) 100 MG 24 hr tablet Take 100 mg by mouth daily. Take with or immediately following a meal.    [provider]  omeprazole (PRILOSEC) 20 MG capsule Take 20 mg by mouth daily.    [provider]    Allergies    Septra [sulfamethoxazole-trimethoprim]  Review of Systems   Review of Systems  Constitutional: Positive for activity change, appetite change, chills, fatigue and fever.  HENT: Positive for congestion, rhinorrhea and sore throat.   Eyes: Negative for visual disturbance.  Respiratory: Positive for cough, chest tightness and shortness of breath.   Cardiovascular: Negative for chest pain.  Gastrointestinal: Positive for abdominal pain, diarrhea and nausea. Negative for vomiting.  Genitourinary: Negative for dysuria, flank pain and hematuria.  Musculoskeletal: Positive for arthralgias and myalgias.  Neurological: Positive for weakness and headaches.   all other systems are negative except as noted in the HPI and PMH.   Physical Exam Updated Vital Signs BP 96/81 (BP Location: Right Arm)   Pulse 81   Temp (!) 102.1 F (38.9 C) (Oral)   Resp 17   Ht  6' (1.829 m)   Wt (!) 158.8 kg   SpO2 94%   BMI 47.47 kg/m   Physical Exam Vitals and nursing note reviewed.  Constitutional:      General: He is not in acute distress.    Appearance: He is well-developed. He is obese. He is ill-appearing.     Comments: Ill-appearing but nontoxic, minimal increased work of breathing  HENT:     Head: Normocephalic and atraumatic.     Mouth/Throat:     Pharynx: No oropharyngeal exudate.  Eyes:     Conjunctiva/sclera: Conjunctivae normal.     Pupils: Pupils are equal, round, and reactive to light.  Neck:     Comments: No meningismus. Cardiovascular:     Rate and Rhythm: Normal rate and regular rhythm.      Heart sounds: Normal heart sounds. No murmur heard.   Pulmonary:     Effort: Pulmonary effort is normal. No respiratory distress.     Breath sounds: Normal breath sounds.  Chest:     Chest wall: No tenderness.  Abdominal:     Palpations: Abdomen is soft.     Tenderness: There is no abdominal tenderness. There is no guarding or rebound.  Musculoskeletal:        General: No tenderness. Normal range of motion.     Cervical back: Normal range of motion and neck supple.  Skin:    General: Skin is warm.  Neurological:     Mental Status: He is alert and oriented to person, place, and time.     Cranial Nerves: No cranial nerve deficit.     Motor: No abnormal muscle tone.     Coordination: Coordination normal.     Comments: No ataxia on finger to nose bilaterally. No pronator drift. 5/5 strength throughout. CN 2-12 intact.Equal grip strength. Sensation intact.   Psychiatric:        Behavior: Behavior normal.     ED Results / Procedures / Treatments   Labs (all labs ordered are listed, but only abnormal results are displayed) Labs Reviewed  ACETAMINOPHEN LEVEL - Abnormal; Notable for the following components:      Result Value   Acetaminophen (Tylenol), Serum <10 (*)    All other components within normal limits  CBC WITH DIFFERENTIAL/PLATELET - Abnormal; Notable for the following components:   WBC 3.1 (*)    Platelets 135 (*)    Lymphs Abs 0.5 (*)    All other components within normal limits  BASIC METABOLIC PANEL - Abnormal; Notable for the following components:   Sodium 133 (*)    CO2 19 (*)    Glucose, Bld 117 (*)    BUN 49 (*)    Creatinine, Ser 4.66 (*)    Calcium 8.7 (*)    GFR calc non Af Amer 14 (*)    GFR calc Af Amer 16 (*)    All other components within normal limits  LACTATE DEHYDROGENASE - Abnormal; Notable for the following components:   LDH 203 (*)    All other components within normal limits  TRIGLYCERIDES - Abnormal; Notable for the following components:    Triglycerides 200 (*)    All other components within normal limits  HEPATIC FUNCTION PANEL - Abnormal; Notable for the following components:   AST 53 (*)    Alkaline Phosphatase 37 (*)    All other components within normal limits  CULTURE, BLOOD (ROUTINE X 2)  CULTURE, BLOOD (ROUTINE X 2)  SARS CORONAVIRUS 2 BY RT PCR [facility]  ORDER, PERFORMED IN [facility])  LACTIC ACID, PLASMA  PROCALCITONIN  LACTIC ACID, PLASMA  D-DIMER, QUANTITATIVE (NOT AT [facility]  FERRITIN  FIBRINOGEN  C-REACTIVE PROTEIN    EKG EKG Interpretation  Date/Time:  Friday December 20 2019 05:19:14 EDT Ventricular Rate:  82 PR Interval:    QRS Duration: 122 QT Interval:  428 QTC Calculation: 500 R Axis:   30 Text Interpretation: Sinus rhythm IVCD, consider atypical RBBB No significant change was found Confirmed by Ezequiel Essex [phone]) on 12/20/2019 7:03:02 AM   Radiology DG Chest Port 1 View  Result Date: 12/20/2019 CLINICAL DATA:  None EXAM: PORTABLE CHEST 1 VIEW COMPARISON:  None. FINDINGS: The lungs are symmetrically expanded. There is a focal opacity seen at the lateral left lung base, indeterminate. This may represent a focal pneumonic infiltrate in the appropriate clinical setting. An underlying nodule is difficult to exclude. No pneumothorax or pleural effusion. Cardiac size within normal limits. Pulmonary vascularity is normal. IMPRESSION: Focal opacity at the lateral left lung base, indeterminate. This may represent a focal pneumonic infiltrate in the appropriate clinical setting. An underlying nodule is difficult to exclude. Follow-up chest radiograph is recommended to document complete resolution. Electronically Signed   By: Fidela Salisbury MD   On: 12/20/2019 03:43    Procedures Procedures (including critical care time)  Medications Ordered in ED Medications - No data to display  ED Course  I have reviewed the triage vital signs and the nursing notes.  Pertinent labs & imaging  results that were available during my care of the patient were reviewed by me and considered in my medical decision making (see chart for details).    MDM Rules/Calculators/A&P                         8 days of Covid-like illness with fever, cough, chest pain, shortness of breath, abdominal pain and diarrhea.  Chronic acetaminophen overdose of 1 g every 4 hours for the past 8 days by his report.  Borderline oxygenation in the low 90s with respirations in the mid 20s.  X-ray does show left-sided infiltrates.  Acetaminophen level is negative.  LFTs are as above.  Discussed with Barnett Applebaum with poison control.  She states with patient's last dose of Tylenol at 4 PM yesterday, undetectable level as well as LFTs that are not triple the normal range, there are no expected toxic effects from acetaminophen use.  N-acetylcysteine not indicated. Ok to use tylenol as needed for fever as usual dosing.   Creatinine today is 4.6.  His baseline appears to be around 3. Will hydrate gently.  O2 saturation is borderline.   Patient tachypneic and borderline hypoxic.  He is placed on supplemental oxygen.  Will initiate remdesivir and Decadron.  Chest x-ray is consistent with left-sided infiltrate.  He is agreeable to admission for IV hydration given his acute on chronic renal failure as well as borderline hypoxia in setting of Covid pneumonia. D/w Dr. Carles Collet.  BARI HANDSHOE was evaluated in Emergency Department on 12/20/2019 for the symptoms described in the history of present illness. He was evaluated in the context of the global COVID-19 pandemic, which necessitated consideration that the patient might be at risk for infection with the SARS-CoV-2 virus that causes COVID-19. Institutional protocols and algorithms that pertain to the evaluation of patients at risk for COVID-19 are in a state of rapid change based on information released by regulatory bodies including the CDC and federal and  state organizations. These  policies and algorithms were followed during the patient's care in the ED.  Final Clinical Impression(s) / ED Diagnoses Final diagnoses:  AKI (acute kidney injury) ([place])  Pneumonia due to COVID-19 virus    Rx / DC Orders ED Discharge Orders    None       Francely Craw, Annie Main, MD 12/20/19 [id number]    Ezequiel Essex, MD 12/20/19 [phone]

## 2019-12-20 NOTE — ED Notes (Signed)
edp in room  

## 2019-12-20 NOTE — ED Notes (Signed)
94% ambulatory on room air .

## 2019-12-20 NOTE — TOC Progression Note (Signed)
Transition of Care [facility]) - Progression Note    Patient Details  Name: Christopher Boyer MRN: [id number] Date of Birth: 29-Jan-1971  [facility] CM/SW Contact  Natasha Bence, LCSW Phone Number: 12/20/2019, 5:05 PM  Clinical Narrative:    CSW spoke with patient to identify potential needs for transport to remdesivir clinic once discharged. Patient reported that they do not require transportation to the identified remdesivir clinic. TOC to follow.        Expected Discharge Plan and Services                                                 Social Determinants of Health (SDOH) Interventions    Readmission Risk Interventions No flowsheet data found.

## 2019-12-20 NOTE — ED Triage Notes (Signed)
Pt here for + home covid test.. family member positive.  pt states he thinks it is going into his lungs starting tonight.  Also worried about how much tylenol he is taking.  Complains of headache. Cough.

## 2019-12-20 NOTE — H&P (Signed)
History and Physical  Christopher Boyer IZT:[id number] DOB: 07/23/1970 DOA: 12/20/2019   PCP: [facility]   Patient coming from: Home  Chief Complaint: cough, sob, fever  HPI:  Christopher Boyer is a 48 y.o. male with medical history of morbid obesity, generalized anxiety disorder, CKD stage IV, hypertension, hyperlipidemia, and vitamin D deficiency presenting with 8-day history of fevers, chills, coughing, dyspnea on exertion, and generalized myalgias.  The patient states that he has had fevers up to 1 to 1.0 F.  His coughing and dyspnea on exertion have gradually worsened over the past 2 to 3 days.  He took a home Covid test approximately 1 week prior to this admission which was positive.  He states that his son also tested positive for Covid around the same time.  He has been exposed to a coworker that was Covid positive recently.  He denies any headache, visual disturbance, vomiting, but he has been having loose stools, and intermittent abdominal cramping.  He denies any dysuria or hematuria.  The patient has been taking up to 6 g of Tylenol a day because of his fevers.  He has been complaining of a sore throat and coughing with yellow/green sputum but denies any hemoptysis. In the emergency department, the patient had a fever up to 100.1 F.  He was hemodynamically stable with oxygen saturation 90-94% on room air.  The patient was started on remdesivir and IV steroids.  He was admitted for further evaluation and treatment of presumptive COVID-19 pneumonia.  His SARS-CoV2 RT-PCR is still pending.  His sodium was 133, potassium 4.3, serum creatinine 4.66 AST 53, ALT 42, alk phosphatase 37, total bilirubin 0.6.  WBC 3.1, hemoglobin 13.4, platelets 135,000    Assessment/Plan: Acute respiratory failure with hypoxia secondary to COVID-19 pneumonia -Oxygen saturations 90-94% on room air -Start remdesivir -Start IV steroids -D-dimer 0.47 -CRP 5.7 -PCT  0.56 -Lactic acid 1.0 -Ferritin 904  COVID-19 gastroenteritis -The patient is having 5-6 loose bowel movements daily -Stool pathogen panel -Check for C. Difficile  Sepsis -present on admission -presented with fever, hypoxia and tachypnea -due to COVID 19 pneumonia -lactate 1.0  Acute on chronic renal failure--CKD4 -baseline creatinine 3.0-3.4 -presented with serum creatinin 4.66 -secondary to volume depletion -start IVF  LLL opacity -likely due to COVID -with elevated PCT, start ceftriaxone and azithro  Essential Hypertension -start lower dose metoprolol due to soft BPs -holding amlodipine -holding lasix due to worsening serum creatinine   Anxiety/Depression -continue wellbutrin and lexapro -continue home dose xanax  GERD -continue PPI       Past Medical History:  Diagnosis Date  . Chronic kidney disease (CKD), stage III (moderate)   . Gout due to renal impairment   . Hypertension   . Leg edema   . Obesity    Past surg hx: none Social History: no tobacco, etoh, drugs   Family history: reviewed.  No pertinent family history  Allergies  Allergen Reactions  . Septra [Sulfamethoxazole-Trimethoprim] Rash     Prior to Admission medications   Medication Sig Start Date End Date Taking? Authorizing Provider  febuxostat (ULORIC) 40 MG tablet Take 40 mg by mouth daily.     [provider]  furosemide (LASIX) 40 MG tablet Take 40 mg by mouth daily.    [provider]  metoprolol succinate (TOPROL-XL) 100 MG 24 hr tablet Take 100 mg by mouth daily. Take with or immediately following a meal.    [provider]  omeprazole (PRILOSEC) 20 MG capsule Take 20 mg by mouth daily.    [provider]    Review of Systems:  Constitutional:  No weight loss, night sweats, Head&Eyes: No headache.  No vision loss.  No eye pain or scotoma ENT:  No Difficulty swallowing,Tooth/dental problems,Sore throat,  No ear ache, post nasal  drip,  Cardio-vascular:  No chest pain, Orthopnea, PND, swelling in lower extremities,  dizziness, palpitations  GI:  No  abdominal pain,  vomiting, diarrhea, loss of appetite, hematochezia, melena, heartburn, indigestion, Resp:  No coughing up of blood .No wheezing.No chest wall deformity  Skin:  no rash or lesions.  GU:  no dysuria, change in color of urine, no urgency or frequency. No flank pain.  Musculoskeletal:  No joint pain or swelling. No decreased range of motion. No back pain.  Psych:  No change in mood or affect. No depression or anxiety. Neurologic: No headache, no dysesthesia, no focal weakness, no vision loss. No syncope  Physical Exam: Vitals:   12/20/19 0253 12/20/19 0518 12/20/19 0630 12/20/19 0744  BP: 96/81  122/78   Pulse: 84 81 81   Resp: (!) 22 17 (!) 27   Temp: (!) 102.1 F (38.9 C)   99.7 F (37.6 C)  TempSrc: Oral   Oral  SpO2: 93% 94% 90%   Weight: (!) 158.8 kg     Height: 6' (1.829 m)      General:  A&O x 3, NAD, nontoxic, pleasant/cooperative Head/Eye: No conjunctival hemorrhage, no icterus, [place]/AT, No nystagmus ENT:  No icterus,  No thrush, good dentition, no pharyngeal exudate Neck:  No masses, no lymphadenpathy, no bruits CV:  RRR, no rub, no gallop, no S3 Lung:  bibaseilar rales. No wheeze Abdomen: soft/NT, +BS, nondistended, no peritoneal signs Ext: No cyanosis, No rashes, No petechiae, No lymphangitis, No edema Neuro: CNII-XII intact, strength 4/5 in bilateral upper and lower extremities, no dysmetria  Labs on Admission:  Basic Metabolic Panel: Recent Labs  Lab 12/20/19 0621  NA 133*  K 4.3  CL 103  CO2 19*  GLUCOSE 117*  BUN 49*  CREATININE 4.66*  CALCIUM 8.7*   Liver Function Tests: Recent Labs  Lab 12/20/19 0621  AST 53*  ALT 42  ALKPHOS 37*  BILITOT 0.6  PROT 6.6  ALBUMIN 3.6   No results for input(s): LIPASE, AMYLASE in the last 168 hours. No results for input(s): AMMONIA in the last 168 hours. CBC: Recent  Labs  Lab 12/20/19 0621  WBC 3.1*  NEUTROABS 2.3  HGB 13.4  HCT 41.5  MCV 89.8  PLT 135*   Coagulation Profile: No results for input(s): INR, PROTIME in the last 168 hours. Cardiac Enzymes: No results for input(s): CKTOTAL, CKMB, CKMBINDEX, TROPONINI in the last 168 hours. BNP: Invalid input(s): POCBNP CBG: No results for input(s): GLUCAP in the last 168 hours. Urine analysis: No results found for: COLORURINE, APPEARANCEUR, LABSPEC, PHURINE, GLUCOSEU, HGBUR, BILIRUBINUR, [place], [place], [place], NITRITE, LEUKOCYTESUR Sepsis Labs: $[email or web address]$ (procalcitonin:4,lacticidven:4) ) Recent Results (from the past 240 hour(s))  Blood Culture (routine x 2)     Status: None (Preliminary result)   Collection Time: 12/20/19  6:11 AM   Specimen: BLOOD  Result Value Ref Range Status   Specimen Description BLOOD LEFT ANTECUBITAL  Final   Special Requests   Final    BOTTLES DRAWN AEROBIC AND ANAEROBIC Blood Culture adequate volume Performed at [facility], [address], [place], [place] [address]    Culture PENDING  Incomplete  Report Status PENDING  Incomplete  Blood Culture (routine x 2)     Status: None (Preliminary result)   Collection Time: 12/20/19  6:21 AM   Specimen: BLOOD  Result Value Ref Range Status   Specimen Description BLOOD LEFT ANTECUBITAL  Final   Special Requests   Final    BOTTLES DRAWN AEROBIC AND ANAEROBIC Blood Culture adequate volume Performed at [facility], [address] [place], [place] [address]    Culture PENDING  Incomplete   Report Status PENDING  Incomplete     Radiological Exams on Admission: DG Chest Port 1 View  Result Date: 12/20/2019 CLINICAL DATA:  None EXAM: PORTABLE CHEST 1 VIEW COMPARISON:  None. FINDINGS: The lungs are symmetrically expanded. There is a focal opacity seen at the lateral left lung base, indeterminate. This may represent a focal pneumonic infiltrate in the appropriate clinical setting. An underlying nodule is  difficult to exclude. No pneumothorax or pleural effusion. Cardiac size within normal limits. Pulmonary vascularity is normal. IMPRESSION: Focal opacity at the lateral left lung base, indeterminate. This may represent a focal pneumonic infiltrate in the appropriate clinical setting. An underlying nodule is difficult to exclude. Follow-up chest radiograph is recommended to document complete resolution. Electronically Signed   By: Fidela Salisbury MD   On: 12/20/2019 03:43    EKG: Independently reviewed. Sinus, RBBB    Time spent:60 minutes Code Status:   FULL Family Communication:  No Family at bedside Disposition Plan: expect 1-2 day hospitalization Consults called: none  DVT Prophylaxis: [place] Heparin     Orson Eva, DO  [facility] Pager [phone]  If 7PM-7AM, please contact night-coverage [email or web address] Password [facility] 12/20/2019, 8:17 AM

## 2019-12-20 NOTE — ED Triage Notes (Signed)
Pt states he has been taking 1g of tylenol every 4 hours. And he cannot take ibuprofen because of his kidneys

## 2019-12-21 LAB — CBC WITH DIFFERENTIAL/PLATELET
Abs Immature Granulocytes: 0.02 10*3/uL (ref 0.00–0.07)
Basophils Absolute: 0 10*3/uL (ref 0.0–0.1)
Basophils Relative: 0 %
Eosinophils Absolute: 0 10*3/uL (ref 0.0–0.5)
Eosinophils Relative: 0 %
HCT: 41.8 % (ref 39.0–52.0)
Hemoglobin: 13.5 g/dL (ref 13.0–17.0)
Immature Granulocytes: 1 %
Lymphocytes Relative: 12 %
Lymphs Abs: 0.4 10*3/uL — ABNORMAL LOW (ref 0.7–4.0)
MCH: 29 pg (ref 26.0–34.0)
MCHC: 32.3 g/dL (ref 30.0–36.0)
MCV: 89.7 fL (ref 80.0–100.0)
Monocytes Absolute: 0.1 10*3/uL (ref 0.1–1.0)
Monocytes Relative: 4 %
Neutro Abs: 2.8 10*3/uL (ref 1.7–7.7)
Neutrophils Relative %: 83 %
Platelets: 144 10*3/uL — ABNORMAL LOW (ref 150–400)
RBC: 4.66 MIL/uL (ref 4.22–5.81)
RDW: 13.8 % (ref 11.5–15.5)
WBC: 3.4 10*3/uL — ABNORMAL LOW (ref 4.0–10.5)
nRBC: 0 % (ref 0.0–0.2)

## 2019-12-21 LAB — COMPREHENSIVE METABOLIC PANEL
ALT: 43 U/L (ref 0–44)
AST: 45 U/L — ABNORMAL HIGH (ref 15–41)
Albumin: 3.1 g/dL — ABNORMAL LOW (ref 3.5–5.0)
Alkaline Phosphatase: 33 U/L — ABNORMAL LOW (ref 38–126)
Anion gap: 11 (ref 5–15)
BUN: 52 mg/dL — ABNORMAL HIGH (ref 6–20)
CO2: 21 mmol/L — ABNORMAL LOW (ref 22–32)
Calcium: 8 mg/dL — ABNORMAL LOW (ref 8.9–10.3)
Chloride: 104 mmol/L (ref 98–111)
Creatinine, Ser: 4.06 mg/dL — ABNORMAL HIGH (ref 0.61–1.24)
GFR calc Af Amer: 19 mL/min — ABNORMAL LOW (ref >60)
GFR calc non Af Amer: 16 mL/min — ABNORMAL LOW (ref >60)
Glucose, Bld: 165 mg/dL — ABNORMAL HIGH (ref 70–99)
Potassium: 4 mmol/L (ref 3.5–5.1)
Sodium: 136 mmol/L (ref 135–145)
Total Bilirubin: 0.6 mg/dL (ref 0.3–1.2)
Total Protein: 6 g/dL — ABNORMAL LOW (ref 6.5–8.1)

## 2019-12-21 LAB — PROCALCITONIN: Procalcitonin: 0.36 ng/mL

## 2019-12-21 LAB — FERRITIN: Ferritin: 928 ng/mL — ABNORMAL HIGH (ref 24–336)

## 2019-12-21 LAB — C-REACTIVE PROTEIN: CRP: 3.1 mg/dL — ABNORMAL HIGH (ref <1.0)

## 2019-12-21 LAB — D-DIMER, QUANTITATIVE: D-Dimer, Quant: 0.36 ug/mL-FEU (ref 0.00–0.50)

## 2019-12-21 MED ORDER — DEXAMETHASONE 6 MG PO TABS: 6.0000 mg | ORAL_TABLET | Freq: Every day | ORAL | 0 refills | Status: DC

## 2019-12-21 MED ORDER — AMOXICILLIN-POT CLAVULANATE 500-125 MG PO TABS: 1.0000 | ORAL_TABLET | Freq: Two times a day (BID) | ORAL | 0 refills | Status: DC

## 2019-12-21 MED ORDER — LOPERAMIDE HCL 2 MG PO CAPS: 2.0000 mg | ORAL_CAPSULE | ORAL | Status: DC | PRN

## 2019-12-21 MED ORDER — METOPROLOL TARTRATE 50 MG PO TABS
75.0000 mg | ORAL_TABLET | Freq: Once | ORAL | Status: AC
  Administered 2019-12-21: 75 mg via ORAL
  Filled 2019-12-21: qty 1

## 2019-12-21 MED ORDER — LOPERAMIDE HCL 2 MG PO CAPS: 2.0000 mg | ORAL_CAPSULE | ORAL | 0 refills | Status: DC | PRN

## 2019-12-21 MED ORDER — METOPROLOL TARTRATE 50 MG PO TABS: 100.0000 mg | ORAL_TABLET | Freq: Two times a day (BID) | ORAL | Status: DC

## 2019-12-21 MED ORDER — ZINC SULFATE 220 (50 ZN) MG PO CAPS: 220.0000 mg | ORAL_CAPSULE | Freq: Every day | ORAL | 0 refills | Status: DC

## 2019-12-21 NOTE — Discharge Summary (Signed)
Physician Discharge Summary  Christopher Boyer YIR:[id number] DOB: Jul 27, 1970 DOA: 12/20/2019  PCP: [facility]  Admit date: 12/20/2019  Discharge date: 12/21/2019  Admitted From:Home  Disposition:  Home  Recommendations for Outpatient Follow-up:  1. Follow up with PCP in 1-2 weeks 2. Please obtain BMP/CBC in one week 3. Continue on dexamethasone as prescribed for 8 more days 4. Continue outpatient remdesivir infusions at 1 PM for the next 3 days to complete 5-day course of treatment 5. Avoid home amlodipine for now until further follow-up with PCP and blood pressure noted to be improved, also hold Lasix 6. Continue other home medications as previously prescribed 7. Continue Augmentin as prescribed for pneumonia 8. Loperamide prescribed as needed for diarrhea   Home Health: None  Equipment/Devices: None  Discharge Condition:Stable  CODE STATUS: Full  Diet recommendation: Heart Healthy  Brief/Interim Summary: Per HPI: Christopher Boyer is a 49 y.o. male with medical history of morbid obesity, generalized anxiety disorder, CKD stage IV, hypertension, hyperlipidemia, and vitamin D deficiency presenting with 8-day history of fevers, chills, coughing, dyspnea on exertion, and generalized myalgias.  The patient states that he has had fevers up to 1 to 1.0 F.  His coughing and dyspnea on exertion have gradually worsened over the past 2 to 3 days.  He took a home Covid test approximately 1 week prior to this admission which was positive.  He states that his son also tested positive for Covid around the same time.  He has been exposed to a coworker that was Covid positive recently.  He denies any headache, visual disturbance, vomiting, but he has been having loose stools, and intermittent abdominal cramping.  He denies any dysuria or hematuria.  The patient has been taking up to 6 g of Tylenol a day because of his fevers.  He has been complaining of a sore  throat and coughing with yellow/green sputum but denies any hemoptysis. In the emergency department, the patient had a fever up to 100.1 F.  He was hemodynamically stable with oxygen saturation 90-94% on room air.  The patient was started on remdesivir and IV steroids.  He was admitted for further evaluation and treatment of presumptive COVID-19 pneumonia.  His SARS-CoV2 RT-PCR is still pending.  His sodium was 133, potassium 4.3, serum creatinine 4.66 AST 53, ALT 42, alk phosphatase 37, total bilirubin 0.6.  WBC 3.1, hemoglobin 13.4, platelets 135,000  Acute respiratory failure with hypoxia secondary to COVID-19 pneumonia -Oxygen saturations 90-94% on room air -Patient on room air with no dyspnea noted -Continue remdesivir infusions as ordered and steroids at home  COVID-19 gastroenteritis -The patient is having 5-6 loose bowel movements daily-improved -C. difficile negative -Loperamide as needed and GI panel pending, but likely secondary to Covid  Sepsis -present on admission -Current physiology has resolved  Acute on chronic renal failure--CKD4 -Creatinine improved from 4.66-4.06 today -Tolerating liquids and diet at this point, will follow up with nephrology outpatient -Does not appear dehydrated at this point -Plan to hold home Lasix for now  LLL opacity -likely due to COVID -with elevated PCT -Patient given Rocephin and azithromycin while hospitalized, will discharge on Augmentin  Essential Hypertension-stable -Resume usual home metoprolol dose with noted atrial fibrillation/a flutter on telemetry monitoring  -Continue to hold home amlodipine and Lasix  History of atrial flutter/atrial fibrillation -Continue home metoprolol as prior as this is a chronic issue that is managed by his PCP  Anxiety/Depression -continue wellbutrin and lexapro -continue home dose xanax  GERD -continue PPI   Discharge Diagnoses:  Active Problems:   Sepsis due to undetermined  organism [facility])   Acute respiratory failure with hypoxia (HCC)   Acute respiratory failure due to COVID-19 [facility])   Acute renal failure superimposed on stage 4 chronic kidney disease [facility])    Discharge Instructions  Discharge Instructions    Diet - low sodium heart healthy   Complete by: As directed    Increase activity slowly   Complete by: As directed      Allergies as of 12/21/2019      Reactions   Septra [sulfamethoxazole-trimethoprim] Rash      Medication List    STOP taking these medications   amLODipine 10 MG tablet Commonly known as: NORVASC   furosemide 40 MG tablet Commonly known as: LASIX     TAKE these medications   ALPRAZolam 0.5 MG tablet Commonly known as: XANAX Take 1 mg by mouth at bedtime.   amoxicillin-clavulanate 500-125 MG tablet Commonly known as: Augmentin Take 1 tablet (500 mg total) by mouth 2 (two) times daily for 5 days.   ascorbic acid 500 MG tablet Commonly known as: VITAMIN C Take 500 mg by mouth daily.   buPROPion 300 MG 24 hr tablet Commonly known as: WELLBUTRIN XL Take 300 mg by mouth daily.   cetirizine 10 MG tablet Commonly known as: ZYRTEC Take by mouth.   Cholecalciferol 25 MCG (1000 UT) tablet Take 1,000 Units by mouth daily.   dexamethasone 6 MG tablet Commonly known as: DECADRON Take 1 tablet (6 mg total) by mouth daily for 8 days.   escitalopram 10 MG tablet Commonly known as: LEXAPRO Take 10 mg by mouth daily.   escitalopram 20 MG tablet Commonly known as: LEXAPRO Take 20 mg by mouth See admin instructions.   febuxostat 40 MG tablet Commonly known as: ULORIC Take 40 mg by mouth daily.   fenofibrate 160 MG tablet Take 160 mg by mouth daily.   loperamide 2 MG capsule Commonly known as: IMODIUM Take 1 capsule (2 mg total) by mouth as needed for diarrhea or loose stools.   metoprolol succinate 100 MG 24 hr tablet Commonly known as: TOPROL-XL Take 100 mg by mouth daily. Take with or immediately following  a meal.   metoprolol tartrate 100 MG tablet Commonly known as: LOPRESSOR Take 100 mg by mouth 2 (two) times daily.   omeprazole 20 MG capsule Commonly known as: PRILOSEC Take 20 mg by mouth daily.   triamcinolone ointment 0.5 % Commonly known as: KENALOG Apply 1 application topically once a week.   zinc sulfate 220 (50 Zn) MG capsule Take 1 capsule (220 mg total) by mouth daily. Start taking on: December 22, 2019       Follow-up Information    [facility] Follow up in 1 week(s).   Specialty: Family Medicine Contact information: [address] [place] [address] [phone]              Allergies  Allergen Reactions  . Septra [Sulfamethoxazole-Trimethoprim] Rash    Consultations:  None   Procedures/Studies: DG Chest Port 1 View  Result Date: 12/20/2019 CLINICAL DATA:  None EXAM: PORTABLE CHEST 1 VIEW COMPARISON:  None. FINDINGS: The lungs are symmetrically expanded. There is a focal opacity seen at the lateral left lung base, indeterminate. This may represent a focal pneumonic infiltrate in the appropriate clinical setting. An underlying nodule is difficult to exclude. No pneumothorax or pleural effusion. Cardiac size  within normal limits. Pulmonary vascularity is normal. IMPRESSION: Focal opacity at the lateral left lung base, indeterminate. This may represent a focal pneumonic infiltrate in the appropriate clinical setting. An underlying nodule is difficult to exclude. Follow-up chest radiograph is recommended to document complete resolution. Electronically Signed   By: Fidela Salisbury MD   On: 12/20/2019 03:43    Discharge Exam: Vitals:   12/20/19 2300 12/21/19 0556  BP: 122/86 115/83  Pulse: 83 78  Resp: 20 20  Temp:  98.7 F (37.1 C)  SpO2: 95% 94%   Vitals:   12/20/19 2025 12/20/19 2236 12/20/19 2300 12/21/19 0556  BP: 114/67  122/86 115/83  Pulse: 88  83 78  Resp: _0 Temp: 98.3 F (36.8  C) 99 F (37.2 C)  98.7 F (37.1 C)  TempSrc:  Oral  Oral  SpO2: 96%  95% 94%  Weight:      Height:        General: Pt is alert, awake, not in acute distress, obese Cardiovascular: RRR, S1/S2 +, no rubs, no gallops Respiratory: CTA bilaterally, no wheezing, no rhonchi Abdominal: Soft, NT, ND, bowel sounds + Extremities: no edema, no cyanosis    The results of significant diagnostics from this hospitalization (including imaging, microbiology, ancillary and laboratory) are listed below for reference.     Microbiology: Recent Results (from the past 240 hour(s))  SARS Coronavirus 2 by RT PCR (hospital order, performed in [facility] hospital lab) Nasopharyngeal Nasopharyngeal Swab     Status: Abnormal   Collection Time: 12/20/19  5:22 AM   Specimen: Nasopharyngeal Swab  Result Value Ref Range Status   SARS Coronavirus 2 POSITIVE (A) NEGATIVE Final    Comment: RESULT CALLED TO, READ BACK BY AND VERIFIED WITH: CRAWFORD,H AT 1010 ON 12/20/19 BY HUFFINES,S (NOTE) SARS-CoV-2 target nucleic acids are DETECTED  SARS-CoV-2 RNA is generally detectable in upper respiratory specimens  during the acute phase of infection.  Positive results are indicative  of the presence of the identified virus, but do not rule out bacterial infection or co-infection with other pathogens not detected by the test.  Clinical correlation with patient history and  other diagnostic information is necessary to determine patient infection status.  The expected result is negative.  Fact Sheet for Patients:   StrictlyIdeas.no   Fact Sheet for Healthcare Providers:   BankingDealers.co.za    This test is not yet approved or cleared by the [place] FDA and  has been authorized for detection and/or diagnosis of SARS-CoV-2 by FDA under an Emergency Use Authorization (EUA).  This EUA will remain in effect (meani ng this test can be used) for the duration of  the  COVID-19 declaration under Section 564(b)(1) of the Act, 21 U.S.C. section 360-bbb-3(b)(1), unless the authorization is terminated or revoked sooner.  Performed at [facility], [address] [place], [place] [address]   Blood Culture (routine x 2)     Status: None (Preliminary result)   Collection Time: 12/20/19  6:11 AM   Specimen: BLOOD  Result Value Ref Range Status   Specimen Description BLOOD LEFT ANTECUBITAL  Final   Special Requests   Final    BOTTLES DRAWN AEROBIC AND ANAEROBIC Blood Culture adequate volume Performed at [facility], [address], [place], [place] [address]    Culture PENDING  Incomplete   Report Status PENDING  Incomplete  Blood Culture (routine x 2)     Status: None (Preliminary result)   Collection Time: 12/20/19  6:21 AM   Specimen: BLOOD  Result Value Ref Range Status   Specimen Description BLOOD LEFT ANTECUBITAL  Final   Special Requests   Final    BOTTLES DRAWN AEROBIC AND ANAEROBIC Blood Culture adequate volume Performed at [facility], [address] [place], [place] [address]    Culture PENDING  Incomplete   Report Status PENDING  Incomplete  C Difficile Quick Screen w PCR reflex     Status: None   Collection Time: 12/20/19 10:49 AM   Specimen: Stool  Result Value Ref Range Status   C Diff antigen NEGATIVE NEGATIVE Final   C Diff toxin NEGATIVE NEGATIVE Final   C Diff interpretation No C. difficile detected.  Final    Comment: Performed at [facility], [address], [place], [place] [address]     Labs: BNP (last 3 results) No results for input(s): BNP in the last 8760 hours. Basic Metabolic Panel: Recent Labs  Lab 12/20/19 0621 12/21/19 0607  NA 133* 136  K 4.3 4.0  CL 103 104  CO2 19* 21*  GLUCOSE 117* 165*  BUN 49* 52*  CREATININE 4.66* 4.06*  CALCIUM 8.7* 8.0*   Liver Function Tests: Recent Labs  Lab 12/20/19 0621 12/21/19 0607  AST 53* 45*  ALT 42 43  ALKPHOS 37* 33*  BILITOT 0.6 0.6  PROT 6.6 6.0*  ALBUMIN  3.6 3.1*   No results for input(s): LIPASE, AMYLASE in the last 168 hours. No results for input(s): AMMONIA in the last 168 hours. CBC: Recent Labs  Lab 12/20/19 0621 12/21/19 0607  WBC 3.1* 3.4*  NEUTROABS 2.3 2.8  HGB 13.4 13.5  HCT 41.5 41.8  MCV 89.8 89.7  PLT 135* 144*   Cardiac Enzymes: No results for input(s): CKTOTAL, CKMB, CKMBINDEX, TROPONINI in the last 168 hours. BNP: Invalid input(s): POCBNP CBG: No results for input(s): GLUCAP in the last 168 hours. D-Dimer Recent Labs    12/20/19 0621 12/21/19 0607  DDIMER 0.47 0.36   Hgb A1c No results for input(s): HGBA1C in the last 72 hours. Lipid Profile Recent Labs    12/20/19 0621  TRIG 200*   Thyroid function studies No results for input(s): TSH, T4TOTAL, T3FREE, THYROIDAB in the last 72 hours.  Invalid input(s): FREET3 Anemia work up [employer]    12/20/19 0621 12/21/19 0607  FERRITIN 904* 928*   Urinalysis No results found for: COLORURINE, APPEARANCEUR, LABSPEC, [place], GLUCOSEU, [place], [place], [place], PROTEINUR, UROBILINOGEN, NITRITE, LEUKOCYTESUR Sepsis Labs Invalid input(s): PROCALCITONIN,  WBC,  LACTICIDVEN Microbiology Recent Results (from the past 240 hour(s))  SARS Coronavirus 2 by RT PCR (hospital order, performed in [facility] hospital lab) Nasopharyngeal Nasopharyngeal Swab     Status: Abnormal   Collection Time: 12/20/19  5:22 AM   Specimen: Nasopharyngeal Swab  Result Value Ref Range Status   SARS Coronavirus 2 POSITIVE (A) NEGATIVE Final    Comment: RESULT CALLED TO, READ BACK BY AND VERIFIED WITH: CRAWFORD,H AT 1010 ON 12/20/19 BY HUFFINES,S (NOTE) SARS-CoV-2 target nucleic acids are DETECTED  SARS-CoV-2 RNA is generally detectable in upper respiratory specimens  during the acute phase of infection.  Positive results are indicative  of the presence of the identified virus, but do not rule out bacterial infection or co-infection with other pathogens not detected by  the test.  Clinical correlation with patient history and  other diagnostic information is necessary to determine patient infection status.  The expected result is negative.  Fact Sheet for Patients:   StrictlyIdeas.no  Fact Sheet for Healthcare Providers:   BankingDealers.co.za    This test is not yet approved or cleared by the [place] FDA and  has been authorized for detection and/or diagnosis of SARS-CoV-2 by FDA under an Emergency Use Authorization (EUA).  This EUA will remain in effect (meani ng this test can be used) for the duration of  the COVID-19 declaration under Section 564(b)(1) of the Act, 21 U.S.C. section 360-bbb-3(b)(1), unless the authorization is terminated or revoked sooner.  Performed at [facility], [address] [place], [place] [address]   Blood Culture (routine x 2)     Status: None (Preliminary result)   Collection Time: 12/20/19  6:11 AM   Specimen: BLOOD  Result Value Ref Range Status   Specimen Description BLOOD LEFT ANTECUBITAL  Final   Special Requests   Final    BOTTLES DRAWN AEROBIC AND ANAEROBIC Blood Culture adequate volume Performed at [facility], [address], [place], [place] [address]    Culture PENDING  Incomplete   Report Status PENDING  Incomplete  Blood Culture (routine x 2)     Status: None (Preliminary result)   Collection Time: 12/20/19  6:21 AM   Specimen: BLOOD  Result Value Ref Range Status   Specimen Description BLOOD LEFT ANTECUBITAL  Final   Special Requests   Final    BOTTLES DRAWN AEROBIC AND ANAEROBIC Blood Culture adequate volume Performed at [facility], [address], [place], [place] [address]    Culture PENDING  Incomplete   Report Status PENDING  Incomplete  C Difficile Quick Screen w PCR reflex     Status: None   Collection Time: 12/20/19 10:49 AM   Specimen: Stool  Result Value Ref Range Status   C Diff antigen NEGATIVE NEGATIVE Final   C Diff toxin  NEGATIVE NEGATIVE Final   C Diff interpretation No C. difficile detected.  Final    Comment: Performed at [facility], [address], [place], [place] [address]     Time coordinating discharge: 35 minutes  SIGNED:   Rodena Goldmann, DO [facility] 12/21/2019, 10:38 AM  If 7PM-7AM, please contact night-coverage [email or web address]

## 2019-12-21 NOTE — Progress Notes (Signed)
The patient is scheduled for a Remdesivir infusion on 8/29, 8/30 and 8/31 at 1 pm. Have the patient come to [place], they will see a COVID infusion banner by the road.  Enter there and turn left. There are marked spaces for Infusion.  Call the number on the sign or [phone] and someone will come out and bring them inside.  If someone is driving them, have them come to the same area and call the number and someone will come outside to get them. Thank you!

## 2019-12-21 NOTE — Discharge Instructions (Signed)
You are scheduled for a Remdesivir infusion on 8/29, 8/30 and 8/31 at 1pm. Please come to [place], you will see a COVID infusion banner by the road.  Enter there and turn left.  There are marked spaces for Infusion. Call the number on the sign or [phone] and someone will come out and bring you inside. If someone is driving you please come to the same area and call the number and someone will come outside to get you. Thank you!

## 2019-12-21 NOTE — Progress Notes (Signed)
Nsg Discharge Note  Admit Date:  12/20/2019 Discharge date: 12/21/2019   Christopher Boyer to be D/C'd Home  per MD order.  AVS completed.  Patient able to verbalize understanding.  Discharge Medication: Allergies as of 12/21/2019      Reactions   Septra [sulfamethoxazole-trimethoprim] Rash      Medication List    STOP taking these medications   amLODipine 10 MG tablet Commonly known as: NORVASC   furosemide 40 MG tablet Commonly known as: LASIX     TAKE these medications   ALPRAZolam 0.5 MG tablet Commonly known as: XANAX Take 1 mg by mouth at bedtime.   amoxicillin-clavulanate 500-125 MG tablet Commonly known as: Augmentin Take 1 tablet (500 mg total) by mouth 2 (two) times daily for 5 days.   ascorbic acid 500 MG tablet Commonly known as: VITAMIN C Take 500 mg by mouth daily.   buPROPion 300 MG 24 hr tablet Commonly known as: WELLBUTRIN XL Take 300 mg by mouth daily.   cetirizine 10 MG tablet Commonly known as: ZYRTEC Take by mouth.   Cholecalciferol 25 MCG (1000 UT) tablet Take 1,000 Units by mouth daily.   dexamethasone 6 MG tablet Commonly known as: DECADRON Take 1 tablet (6 mg total) by mouth daily for 8 days.   escitalopram 10 MG tablet Commonly known as: LEXAPRO Take 10 mg by mouth daily.   escitalopram 20 MG tablet Commonly known as: LEXAPRO Take 20 mg by mouth See admin instructions.   febuxostat 40 MG tablet Commonly known as: ULORIC Take 40 mg by mouth daily. Notes to patient: Patient states he does not take this medication.    fenofibrate 160 MG tablet Take 160 mg by mouth daily.   loperamide 2 MG capsule Commonly known as: IMODIUM Take 1 capsule (2 mg total) by mouth as needed for diarrhea or loose stools.   metoprolol succinate 100 MG 24 hr tablet Commonly known as: TOPROL-XL Take 100 mg by mouth daily. Take with or immediately following a meal.   metoprolol tartrate 100 MG tablet Commonly known as: LOPRESSOR Take 100 mg by  mouth 2 (two) times daily.   omeprazole 20 MG capsule Commonly known as: PRILOSEC Take 20 mg by mouth daily.   triamcinolone ointment 0.5 % Commonly known as: KENALOG Apply 1 application topically once a week.   zinc sulfate 220 (50 Zn) MG capsule Take 1 capsule (220 mg total) by mouth daily. Start taking on: December 22, 2019       Discharge Assessment: Vitals:   12/20/19 2300 12/21/19 0556  BP: 122/86 115/83  Pulse: 83 78  Resp: 20 20  Temp:  98.7 F (37.1 C)  SpO2: 95% 94%   Skin clean, dry and intact without evidence of skin break down, no evidence of skin tears noted. IV catheter discontinued intact. Site without signs and symptoms of complications - no redness or edema noted at insertion site, patient denies c/o pain - only slight tenderness at site.  Dressing with slight pressure applied.  D/c Instructions-Education: Discharge instructions given to patient with verbalized understanding. D/c education completed with patient including follow up instructions, medication list, d/c activities limitations if indicated, with other d/c instructions as indicated by MD - patient able to verbalize understanding, all questions fully answered. Patient instructed to return to ED, call 911, or call MD for any changes in condition.  Patient escorted via [place], and D/C home via private auto.  Christopher Stumpp Loletha Grayer, RN 12/21/2019 11:58 AM

## 2019-12-22 ENCOUNTER — Other Ambulatory Visit: Payer: Self-pay

## 2019-12-22 ENCOUNTER — Encounter (HOSPITAL_COMMUNITY): Payer: Self-pay | Admitting: Emergency Medicine

## 2019-12-22 ENCOUNTER — Inpatient Hospital Stay (HOSPITAL_COMMUNITY)
Admission: EM | Admit: 2019-12-22 | Discharge: 2019-12-24 | DRG: 308 | Disposition: A | Discharge status: Home / Self Care | Payer: 59 | Attending: Internal Medicine | Admitting: Internal Medicine

## 2019-12-22 ENCOUNTER — Ambulatory Visit (HOSPITAL_COMMUNITY): Payer: 59

## 2019-12-22 ENCOUNTER — Emergency Department (HOSPITAL_COMMUNITY): Payer: 59

## 2019-12-22 DIAGNOSIS — E785 Hyperlipidemia, unspecified: Secondary | ICD-10-CM | POA: Diagnosis present

## 2019-12-22 DIAGNOSIS — J1282 Pneumonia due to coronavirus disease 2019: Secondary | ICD-10-CM

## 2019-12-22 DIAGNOSIS — I4891 Unspecified atrial fibrillation: Secondary | ICD-10-CM

## 2019-12-22 DIAGNOSIS — I480 Paroxysmal atrial fibrillation: Secondary | ICD-10-CM | POA: Diagnosis not present

## 2019-12-22 DIAGNOSIS — F329 Major depressive disorder, single episode, unspecified: Secondary | ICD-10-CM | POA: Diagnosis present

## 2019-12-22 DIAGNOSIS — K219 Gastro-esophageal reflux disease without esophagitis: Secondary | ICD-10-CM | POA: Diagnosis present

## 2019-12-22 DIAGNOSIS — R079 Chest pain, unspecified: Secondary | ICD-10-CM

## 2019-12-22 DIAGNOSIS — F411 Generalized anxiety disorder: Secondary | ICD-10-CM | POA: Diagnosis present

## 2019-12-22 DIAGNOSIS — Z882 Allergy status to sulfonamides status: Secondary | ICD-10-CM

## 2019-12-22 DIAGNOSIS — F419 Anxiety disorder, unspecified: Secondary | ICD-10-CM

## 2019-12-22 DIAGNOSIS — Z79899 Other long term (current) drug therapy: Secondary | ICD-10-CM

## 2019-12-22 DIAGNOSIS — I4892 Unspecified atrial flutter: Secondary | ICD-10-CM | POA: Diagnosis not present

## 2019-12-22 DIAGNOSIS — N184 Chronic kidney disease, stage 4 (severe): Secondary | ICD-10-CM

## 2019-12-22 DIAGNOSIS — I10 Essential (primary) hypertension: Secondary | ICD-10-CM

## 2019-12-22 DIAGNOSIS — Z6841 Body Mass Index (BMI) 40.0 and over, adult: Secondary | ICD-10-CM

## 2019-12-22 DIAGNOSIS — U071 COVID-19: Secondary | ICD-10-CM

## 2019-12-22 DIAGNOSIS — I129 Hypertensive chronic kidney disease with stage 1 through stage 4 chronic kidney disease, or unspecified chronic kidney disease: Secondary | ICD-10-CM | POA: Diagnosis present

## 2019-12-22 HISTORY — DX: Unspecified atrial fibrillation: I48.91

## 2019-12-22 LAB — GASTROINTESTINAL PANEL BY PCR, STOOL (REPLACES STOOL CULTURE)

## 2019-12-22 LAB — BASIC METABOLIC PANEL
Anion gap: 10 (ref 5–15)
BUN: 54 mg/dL — ABNORMAL HIGH (ref 6–20)
CO2: 19 mmol/L — ABNORMAL LOW (ref 22–32)
Calcium: 8.5 mg/dL — ABNORMAL LOW (ref 8.9–10.3)
Chloride: 108 mmol/L (ref 98–111)
Creatinine, Ser: 3.49 mg/dL — ABNORMAL HIGH (ref 0.61–1.24)
GFR calc Af Amer: 23 mL/min — ABNORMAL LOW (ref >60)
GFR calc non Af Amer: 20 mL/min — ABNORMAL LOW (ref >60)
Glucose, Bld: 121 mg/dL — ABNORMAL HIGH (ref 70–99)
Potassium: 4 mmol/L (ref 3.5–5.1)
Sodium: 137 mmol/L (ref 135–145)

## 2019-12-22 LAB — CBC
HCT: 43.8 % (ref 39.0–52.0)
Hemoglobin: 14.4 g/dL (ref 13.0–17.0)
MCH: 28.7 pg (ref 26.0–34.0)
MCHC: 32.9 g/dL (ref 30.0–36.0)
MCV: 87.3 fL (ref 80.0–100.0)
Platelets: 233 10*3/uL (ref 150–400)
RBC: 5.02 MIL/uL (ref 4.22–5.81)
RDW: 13.9 % (ref 11.5–15.5)
WBC: 9.9 10*3/uL (ref 4.0–10.5)
nRBC: 0 % (ref 0.0–0.2)

## 2019-12-22 LAB — TROPONIN I (HIGH SENSITIVITY)
Troponin I (High Sensitivity): 23 ng/L — ABNORMAL HIGH (ref <18)
Troponin I (High Sensitivity): 22 ng/L — ABNORMAL HIGH (ref <18)

## 2019-12-22 LAB — TSH: TSH: 0.435 u[IU]/mL (ref 0.350–4.500)

## 2019-12-22 MED ORDER — DILTIAZEM LOAD VIA INFUSION
10.0000 mg | Freq: Once | INTRAVENOUS | Status: AC
  Administered 2019-12-22: 10 mg via INTRAVENOUS
  Filled 2019-12-22: qty 10

## 2019-12-22 MED ORDER — BUPROPION HCL ER (XL) 150 MG PO TB24
300.0000 mg | ORAL_TABLET | Freq: Every day | ORAL | Status: DC
  Administered 2019-12-23 – 2019-12-24 (×2): 300 mg via ORAL
  Filled 2019-12-22 (×2): qty 2

## 2019-12-22 MED ORDER — METHYLPREDNISOLONE SODIUM SUCC 125 MG IJ SOLR: 125.0000 mg | Freq: Once | INTRAMUSCULAR | Status: DC | PRN

## 2019-12-22 MED ORDER — PANTOPRAZOLE SODIUM 40 MG PO TBEC
40.0000 mg | DELAYED_RELEASE_TABLET | Freq: Every day | ORAL | Status: DC
  Administered 2019-12-23 – 2019-12-24 (×2): 40 mg via ORAL
  Filled 2019-12-22 (×2): qty 1

## 2019-12-22 MED ORDER — ORAL CARE MOUTH RINSE
15.0000 mL | Freq: Two times a day (BID) | OROMUCOSAL | Status: DC
  Administered 2019-12-23 – 2019-12-24 (×3): 15 mL via OROMUCOSAL

## 2019-12-22 MED ORDER — ZINC SULFATE 220 (50 ZN) MG PO CAPS
220.0000 mg | ORAL_CAPSULE | Freq: Every day | ORAL | Status: DC
  Administered 2019-12-22 – 2019-12-24 (×3): 220 mg via ORAL
  Filled 2019-12-22 (×3): qty 1

## 2019-12-22 MED ORDER — DIPHENHYDRAMINE HCL 50 MG/ML IJ SOLN: 50.0000 mg | Freq: Once | INTRAMUSCULAR | Status: DC | PRN

## 2019-12-22 MED ORDER — DEXAMETHASONE SODIUM PHOSPHATE 10 MG/ML IJ SOLN
6.0000 mg | INTRAMUSCULAR | Status: DC
  Administered 2019-12-22 – 2019-12-23 (×2): 6 mg via INTRAVENOUS
  Filled 2019-12-22 (×2): qty 1

## 2019-12-22 MED ORDER — ALPRAZOLAM 0.25 MG PO TABS
0.2500 mg | ORAL_TABLET | Freq: Two times a day (BID) | ORAL | Status: DC | PRN
  Administered 2019-12-22: 0.25 mg via ORAL

## 2019-12-22 MED ORDER — FENOFIBRATE 160 MG PO TABS
160.0000 mg | ORAL_TABLET | Freq: Every day | ORAL | Status: DC
  Administered 2019-12-24: 160 mg via ORAL
  Filled 2019-12-22 (×4): qty 1

## 2019-12-22 MED ORDER — SODIUM CHLORIDE 0.9 % IV SOLN
100.0000 mg | Freq: Every day | INTRAVENOUS | Status: AC
  Administered 2019-12-22 – 2019-12-24 (×3): 100 mg via INTRAVENOUS
  Filled 2019-12-22 (×3): qty 20

## 2019-12-22 MED ORDER — SODIUM CHLORIDE 0.9 % IV SOLN: INTRAVENOUS | Status: DC | PRN

## 2019-12-22 MED ORDER — EPINEPHRINE 0.3 MG/0.3ML IJ SOAJ: 0.3000 mg | Freq: Once | INTRAMUSCULAR | Status: DC | PRN

## 2019-12-22 MED ORDER — ASCORBIC ACID 500 MG PO TABS
500.0000 mg | ORAL_TABLET | Freq: Every day | ORAL | Status: DC
  Administered 2019-12-22 – 2019-12-24 (×3): 500 mg via ORAL
  Filled 2019-12-22 (×3): qty 1

## 2019-12-22 MED ORDER — LOPERAMIDE HCL 2 MG PO CAPS
2.0000 mg | ORAL_CAPSULE | ORAL | Status: DC | PRN
  Administered 2019-12-22 – 2019-12-24 (×3): 2 mg via ORAL
  Filled 2019-12-22 (×3): qty 1

## 2019-12-22 MED ORDER — FAMOTIDINE IN NACL 20-0.9 MG/50ML-% IV SOLN: 20.0000 mg | Freq: Once | INTRAVENOUS | Status: DC | PRN

## 2019-12-22 MED ORDER — HYDROCOD POLST-CPM POLST ER 10-8 MG/5ML PO SUER: 5.0000 mL | Freq: Two times a day (BID) | ORAL | Status: DC | PRN

## 2019-12-22 MED ORDER — DILTIAZEM HCL-DEXTROSE 125-5 MG/125ML-% IV SOLN (PREMIX)
5.0000 mg/h | INTRAVENOUS | Status: DC
  Administered 2019-12-22 – 2019-12-23 (×2): 5 mg/h via INTRAVENOUS
  Filled 2019-12-22 (×2): qty 125

## 2019-12-22 MED ORDER — ALBUTEROL SULFATE HFA 108 (90 BASE) MCG/ACT IN AERS: 2.0000 | INHALATION_SPRAY | Freq: Once | RESPIRATORY_TRACT | Status: DC | PRN

## 2019-12-22 MED ORDER — ACETAMINOPHEN 325 MG PO TABS
650.0000 mg | ORAL_TABLET | Freq: Four times a day (QID) | ORAL | Status: DC | PRN
  Administered 2019-12-22: 650 mg via ORAL
  Filled 2019-12-22: qty 2

## 2019-12-22 MED ORDER — ONDANSETRON HCL 4 MG PO TABS: 4.0000 mg | ORAL_TABLET | Freq: Four times a day (QID) | ORAL | Status: DC | PRN

## 2019-12-22 MED ORDER — METOPROLOL TARTRATE 50 MG PO TABS
100.0000 mg | ORAL_TABLET | Freq: Two times a day (BID) | ORAL | Status: DC
  Administered 2019-12-22 – 2019-12-24 (×4): 100 mg via ORAL
  Filled 2019-12-22 (×4): qty 2

## 2019-12-22 MED ORDER — ENOXAPARIN SODIUM 40 MG/0.4ML ~~LOC~~ SOLN: 40.0000 mg | SUBCUTANEOUS | Status: DC

## 2019-12-22 MED ORDER — SODIUM CHLORIDE 0.9 % IV SOLN: 100.0000 mg | Freq: Once | INTRAVENOUS | Status: DC

## 2019-12-22 MED ORDER — AMOXICILLIN-POT CLAVULANATE 500-125 MG PO TABS
1.0000 | ORAL_TABLET | Freq: Two times a day (BID) | ORAL | Status: DC
  Administered 2019-12-22 – 2019-12-24 (×4): 500 mg via ORAL
  Filled 2019-12-22 (×4): qty 1

## 2019-12-22 MED ORDER — GUAIFENESIN-DM 100-10 MG/5ML PO SYRP: 10.0000 mL | ORAL_SOLUTION | ORAL | Status: DC | PRN

## 2019-12-22 MED ORDER — ESCITALOPRAM OXALATE 10 MG PO TABS
10.0000 mg | ORAL_TABLET | Freq: Every day | ORAL | Status: DC
  Administered 2019-12-23 – 2019-12-24 (×2): 10 mg via ORAL
  Filled 2019-12-22 (×2): qty 1

## 2019-12-22 MED ORDER — ONDANSETRON HCL 4 MG/2ML IJ SOLN: 4.0000 mg | Freq: Four times a day (QID) | INTRAMUSCULAR | Status: DC | PRN

## 2019-12-22 MED ORDER — ALPRAZOLAM 0.5 MG PO TABS
1.0000 mg | ORAL_TABLET | Freq: Every day | ORAL | Status: DC
  Administered 2019-12-22 – 2019-12-23 (×2): 1 mg via ORAL
  Filled 2019-12-22 (×3): qty 2

## 2019-12-22 MED ORDER — ENOXAPARIN SODIUM 80 MG/0.8ML ~~LOC~~ SOLN
80.0000 mg | SUBCUTANEOUS | Status: DC
  Administered 2019-12-22: 80 mg via SUBCUTANEOUS
  Filled 2019-12-22: qty 0.8

## 2019-12-22 NOTE — ED Triage Notes (Signed)
Here yesterday  Back today w complaint of cp   covid pos  No vaccine

## 2019-12-22 NOTE — ED Provider Notes (Signed)
[facility] Provider Note   CSN: [id number] Arrival date & time: 12/22/19  1102     History Chief Complaint  Patient presents with  . Chest Pain    covid    Christopher Boyer is a 49 y.o. male.  HPI  Patient presents with chest pain.  Woke with this morning.  Mid chest.  Feels his heart racing.  Discharge from the hospital yesterday after Covid pneumonia.  Also been on antibiotics.  States he thinks that his metoprolol dose may have been "messed up".  No swelling his legs.  States he is having some sharp chest pain in the mid chest but states that is improved.  States he has had palpitations in the past but never been told he had atrial fibrillation.       Past Medical History:  Diagnosis Date  . Chronic kidney disease (CKD), stage III (moderate)   . Gout due to renal impairment   . Hypertension   . Leg edema   . Obesity     Patient Active Problem List   Diagnosis Date Noted  . Sepsis due to undetermined organism ([place]) 12/20/2019  . Acute respiratory failure with hypoxia ([place]) 12/20/2019  . Acute respiratory failure due to COVID-19 ([place]) 12/20/2019  . Acute renal failure superimposed on stage 4 chronic kidney disease ([place]) 12/20/2019    History reviewed. No pertinent surgical history.     History reviewed. No pertinent family history.  Social History   Tobacco Use  . Smoking status: Never Smoker  . Smokeless tobacco: Never Used  Substance Use Topics  . Alcohol use: Never  . Drug use: Never    Home Medications Prior to Admission medications   Medication Sig Start Date End Date Taking? Authorizing Provider  ALPRAZolam Duanne Moron) 0.5 MG tablet Take 1 mg by mouth at bedtime.  12/13/19   [provider]  amoxicillin-clavulanate (AUGMENTIN) 500-125 MG tablet Take 1 tablet (500 mg total) by mouth 2 (two) times daily for 5 days. 12/21/19 12/26/19  Manuella Ghazi, Pratik D, DO  ascorbic acid (VITAMIN C) 500 MG tablet Take 500 mg by mouth daily.      [provider]  buPROPion (WELLBUTRIN XL) 300 MG 24 hr tablet Take 300 mg by mouth daily.  03/26/19 03/25/20  [provider]  cetirizine (ZYRTEC) 10 MG tablet Take by mouth.    [provider]  Cholecalciferol 25 MCG (1000 UT) tablet Take 1,000 Units by mouth daily.  11/27/17   [provider]  dexamethasone (DECADRON) 6 MG tablet Take 1 tablet (6 mg total) by mouth daily for 8 days. 12/21/19 12/29/19  Manuella Ghazi, Pratik D, DO  escitalopram (LEXAPRO) 10 MG tablet Take 10 mg by mouth daily. 11/23/19   [provider]  escitalopram (LEXAPRO) 20 MG tablet Take 20 mg by mouth See admin instructions.  12/15/19   [provider]  febuxostat (ULORIC) 40 MG tablet Take 40 mg by mouth daily.  Patient not taking: Reported on 12/20/2019    [provider]  fenofibrate 160 MG tablet Take 160 mg by mouth daily. 12/01/19   [provider]  loperamide (IMODIUM) 2 MG capsule Take 1 capsule (2 mg total) by mouth as needed for diarrhea or loose stools. 12/21/19   Manuella Ghazi, Pratik D, DO  metoprolol succinate (TOPROL-XL) 100 MG 24 hr tablet Take 100 mg by mouth daily. Take with or immediately following a meal. Patient not taking: Reported on 12/20/2019    [provider]  metoprolol tartrate (LOPRESSOR) 100 MG tablet Take 100 mg by mouth 2 (two) times daily. 11/26/19   [provider]  omeprazole (PRILOSEC) 20 MG capsule Take 20 mg by mouth daily.    [provider]  triamcinolone ointment (KENALOG) 0.5 % Apply 1 application topically once a week.  12/02/19   [provider]  zinc sulfate 220 (50 Zn) MG capsule Take 1 capsule (220 mg total) by mouth daily. 12/22/19 01/21/20  Heath Lark D, DO    Allergies    Septra [sulfamethoxazole-trimethoprim]  Review of Systems   Review of Systems  Constitutional: Positive for appetite change and fever.  Respiratory: Positive for shortness of breath.   Gastrointestinal: Positive for nausea.    Genitourinary: Negative for flank pain.  Musculoskeletal: Positive for myalgias. Negative for back pain.  Skin: Negative for rash.  Neurological: Negative for weakness.  Psychiatric/Behavioral: Negative for confusion.    Physical Exam Updated Vital Signs BP (!) 120/98   Pulse 71   Temp 99.3 F (37.4 C) (Oral)   Resp (!) 22   Ht 5\' 10"  (1.778 m)   Wt (!) 158.8 kg   SpO2 95%   BMI 50.22 kg/m   Physical Exam Vitals and nursing note reviewed.  HENT:     Head: Normocephalic.  Cardiovascular:     Rate and Rhythm: Tachycardia present. Rhythm irregular.  Pulmonary:     Breath sounds: No wheezing or rhonchi.  Chest:     Chest wall: No edema.  Abdominal:     Tenderness: There is no abdominal tenderness.  Musculoskeletal:     Right lower leg: No edema.     Left lower leg: No edema.  Skin:    General: Skin is warm.     Capillary Refill: Capillary refill takes less than 2 seconds.  Neurological:     Mental Status: He is alert and oriented to person, place, and time.     ED Results / Procedures / Treatments   Labs (all labs ordered are listed, but only abnormal results are displayed) Labs Reviewed  BASIC METABOLIC PANEL - Abnormal; Notable for the following components:      Result Value   CO2 19 (*)    Glucose, Bld 121 (*)    BUN 54 (*)    Creatinine, Ser 3.49 (*)    Calcium 8.5 (*)    GFR calc non Af Amer 20 (*)    GFR calc Af Amer 23 (*)    All other components within normal limits  TROPONIN I (HIGH SENSITIVITY) - Abnormal; Notable for the following components:   Troponin I (High Sensitivity) 23 (*)    All other components within normal limits  CBC  TSH    EKG EKG Interpretation  Date/Time:  Sunday December 22 2019 11:35:15 EDT Ventricular Rate:  119 PR Interval:    QRS Duration: 114 QT Interval:  394 QTC Calculation: 554 R Axis:   16 Text Interpretation: Atrial flutter with variable A-V block with premature ventricular or aberrantly conducted complexes  Low voltage QRS Incomplete right bundle branch block Nonspecific ST and T wave abnormality Prolonged QT Abnormal ECG atrial flutter is new Confirmed by Davonna Belling [phone]) on 12/22/2019 11:55:06 AM   EKG Interpretation  Date/Time:  Sunday December 22 2019 12:51:33 EDT Ventricular Rate:  72 PR Interval:    QRS Duration: 109 QT Interval:  416 QTC Calculation: 456 R Axis:   -38 Text Interpretation: Atrial flutter with predominant 4:1 AV block Left axis deviation Repol  abnrm, severe global ischemia (LM/MVD) Confirmed by Davonna Belling [phone]) on 12/22/2019 1:04:55 PM        Radiology No results found.  Procedures Procedures (including critical care time)  Medications Ordered in ED Medications  diltiazem (CARDIZEM) 1 mg/mL load via infusion 10 mg (10 mg Intravenous Bolus from Bag 12/22/19 1246)    And  diltiazem (CARDIZEM) 125 mg in dextrose 5% 125 mL (1 mg/mL) infusion (5 mg/hr Intravenous New Bag/Given 12/22/19 1246)    ED Course  I have reviewed the triage vital signs and the nursing notes.  Pertinent labs & imaging results that were available during my care of the patient were reviewed by me and considered in my medical decision making (see chart for details).    MDM Rules/Calculators/A&P                          Patient presents with new onset atrial fibrillation/flutter with RVR.  CHA2DS2-VASc score of 1 for hypertension.  Also had chest pain.  Known Covid disease and discharged yesterday.  Yesterday did not have the A. fib.  Not on oxygen and not hypoxic.  However he was hypoxic yesterday and with that and the Covid infection I feel as if he is a poor candidate for ED cardioversion of the A. fib even though we are within the first 48 hours.  Cardizem drip started.  Will require admission to hospital.  Chest pain improved on reevaluation with rate control.  Troponin mildly elevated.  Think more likely related although a Covid myocarditis is also considered.  CRITICAL  CARE Performed by: Davonna Belling Total critical care time: 30 minutes Critical care time was exclusive of separately billable procedures and treating other patients. Critical care was necessary to treat or prevent imminent or life-threatening deterioration. Critical care was time spent personally by me on the following activities: development of treatment plan with patient and/or surrogate as well as nursing, discussions with consultants, evaluation of patient's response to treatment, examination of patient, obtaining history from patient or surrogate, ordering and performing treatments and interventions, ordering and review of laboratory studies, ordering and review of radiographic studies, pulse oximetry and re-evaluation of patient's condition.  Final Clinical Impression(s) / ED Diagnoses Final diagnoses:  COVID-19  Atrial flutter with rapid ventricular response [facility])    Rx / DC Orders ED Discharge Orders    None       Davonna Belling, MD 12/22/19 1307

## 2019-12-22 NOTE — H&P (Signed)
History and Physical    [name]:[id number] DOB: 1971/04/16 DOA: 12/22/2019  PCP: [facility]   Patient coming from: Home  I have personally briefly reviewed patient's old medical records in [place]  Chief Complaint: Chest pain  HPI: Christopher Boyer is a 49 y.o. male with medical history significant of morbid obesity, generalized anxiety disorder, CKD stage IV, hypertension, hyperlipidemia, vitamin D deficiency, recent diagnosis of COVID-19 pneumonia for which he was hospitalized on 12/20/2019 and discharged on 12/21/2019 on oral Decadron and outpatient remdesivir infusion presented today with chest pain.  Patient states that he woke up this morning with chest pain in the mid chest area, sharp in nature, 5-7 out of 10 in intensity, no relieving or aggravating factors along with palpitations.  Denies any nausea, vomiting.  Felt anxious and had increased cough.  Denies any trauma to chest.  No loss of consciousness or syncope.  He was discharged yesterday and was supposed to be taking oral Decadron and antibiotics but has not filled this prescription yet.  He states that his metoprolol dose was 'messed up' during the hospitalization.  Patient denies any decrease in urine output or swelling of his legs.  Patient has history of palpitations in the past.  ED Course: He was found to be in A. fib with RVR for which he was started on Cardizem drip.  Chest x-ray showed ill-defined opacities in both lower lungs Hospitalist service was called to evaluate the patient.  Review of Systems: As per HPI otherwise all other systems were reviewed and are negative.   Past Medical History:  Diagnosis Date  . Chronic kidney disease (CKD), stage III (moderate)   . Gout due to renal impairment   . Hypertension   . Leg edema   . Obesity     History reviewed. No pertinent surgical history.   reports that he has never smoked. He has never used smokeless  tobacco. He reports that he does not drink alcohol and does not use drugs.  Allergies  Allergen Reactions  . Septra [Sulfamethoxazole-Trimethoprim] Rash    History reviewed. No pertinent family history.  Prior to Admission medications   Medication Sig Start Date End Date Taking? Authorizing Provider  ALPRAZolam Duanne Moron) 0.5 MG tablet Take 1 mg by mouth at bedtime.  12/13/19   [provider]  amoxicillin-clavulanate (AUGMENTIN) 500-125 MG tablet Take 1 tablet (500 mg total) by mouth 2 (two) times daily for 5 days. 12/21/19 12/26/19  Manuella Ghazi, Pratik D, DO  ascorbic acid (VITAMIN C) 500 MG tablet Take 500 mg by mouth daily.     [provider]  buPROPion (WELLBUTRIN XL) 300 MG 24 hr tablet Take 300 mg by mouth daily.  03/26/19 03/25/20  [provider]  cetirizine (ZYRTEC) 10 MG tablet Take by mouth.    [provider]  Cholecalciferol 25 MCG (1000 UT) tablet Take 1,000 Units by mouth daily.  11/27/17   [provider]  dexamethasone (DECADRON) 6 MG tablet Take 1 tablet (6 mg total) by mouth daily for 8 days. 12/21/19 12/29/19  Manuella Ghazi, Pratik D, DO  escitalopram (LEXAPRO) 10 MG tablet Take 10 mg by mouth daily. 11/23/19   [provider]  escitalopram (LEXAPRO) 20 MG tablet Take 20 mg by mouth See admin instructions.  12/15/19   [provider]  febuxostat (ULORIC) 40 MG tablet Take 40 mg by mouth daily.  Patient not taking: Reported on 12/20/2019    [provider]  fenofibrate 160 MG tablet Take 160 mg by mouth daily. 12/01/19   [provider]  loperamide (IMODIUM) 2 MG capsule Take 1 capsule (2 mg total) by mouth as needed for diarrhea or loose stools. 12/21/19   Manuella Ghazi, Pratik D, DO  metoprolol succinate (TOPROL-XL) 100 MG 24 hr tablet Take 100 mg by mouth daily. Take with or immediately following a meal. Patient not taking: Reported on 12/20/2019    [provider]  metoprolol tartrate (LOPRESSOR) 100 MG tablet Take 100 mg  by mouth 2 (two) times daily. 11/26/19   [provider]  omeprazole (PRILOSEC) 20 MG capsule Take 20 mg by mouth daily.    [provider]  triamcinolone ointment (KENALOG) 0.5 % Apply 1 application topically once a week.  12/02/19   [provider]  zinc sulfate 220 (50 Zn) MG capsule Take 1 capsule (220 mg total) by mouth daily. 12/22/19 01/21/20  Heath Lark D, DO    Physical Exam: Vitals:   12/22/19 1142 12/22/19 1200 12/22/19 1245  BP: (!) 143/88 109/85 (!) 120/98  Pulse: 61 71 71  Resp: 20 17 (!) 22  Temp: 99.3 F (37.4 C)    TempSrc: Oral    SpO2: 94% 92% 95%  Weight: (!) 158.8 kg    Height: 5\' 10"  (1.778 m)      Constitutional: NAD, calm, comfortable Vitals:   12/22/19 1142 12/22/19 1200 12/22/19 1245  BP: (!) 143/88 109/85 (!) 120/98  Pulse: 61 71 71  Resp: 20 17 (!) 22  Temp: 99.3 F (37.4 C)    TempSrc: Oral    SpO2: 94% 92% 95%  Weight: (!) 158.8 kg    Height: 5\' 10"  (1.778 m)     Eyes: PERRL, lids and conjunctivae normal ENMT: Mucous membranes are moist. Posterior pharynx clear of any exudate or lesions. Neck: normal, supple, no masses, no thyromegaly Respiratory: bilateral decreased breath sounds at bases, scattered crackles.  Intermittently tachypneic.  No accessory muscle use.  Cardiovascular: S1 S2 positive, tachycardic.  Mild lower extremity edema present.  2+ pedal pulses.  Abdomen: Morbidly obese, no tenderness, no masses palpated. No hepatosplenomegaly. Bowel sounds positive.  Musculoskeletal: no clubbing / cyanosis. No joint deformity upper and lower extremities.  Skin: no rashes, lesions, ulcers. No induration Neurologic: CN 2-12 grossly intact. Moving extremities. No focal neurologic deficits.  Psychiatric: Looks anxious. Alert and oriented x 3.    Labs on Admission: I have personally reviewed following labs and imaging studies  CBC: Recent Labs  Lab 12/20/19 0621 12/21/19 0607 12/22/19 1154  WBC 3.1* 3.4* 9.9    NEUTROABS 2.3 2.8  --   HGB 13.4 13.5 14.4  HCT 41.5 41.8 43.8  MCV 89.8 89.7 87.3  PLT 135* 144* 924   Basic Metabolic Panel: Recent Labs  Lab 12/20/19 0621 12/21/19 0607 12/22/19 1154  NA 133* 136 137  K 4.3 4.0 4.0  CL 103 104 108  CO2 19* 21* 19*  GLUCOSE 117* 165* 121*  BUN 49* 52* 54*  CREATININE 4.66* 4.06* 3.49*  CALCIUM 8.7* 8.0* 8.5*   GFR: Estimated Creatinine Clearance: 39.7 mL/min (A) (by C-G formula based on SCr of 3.49 mg/dL (H)). Liver Function Tests: Recent Labs  Lab 12/20/19 0621 12/21/19 0607  AST 53* 45*  ALT 42 43  ALKPHOS 37* 33*  BILITOT 0.6 0.6  PROT 6.6 6.0*  ALBUMIN 3.6 3.1*   No results for input(s): LIPASE, AMYLASE in the last 168 hours. No results for input(s): AMMONIA in  the last 168 hours. Coagulation Profile: No results for input(s): INR, PROTIME in the last 168 hours. Cardiac Enzymes: No results for input(s): CKTOTAL, CKMB, CKMBINDEX, TROPONINI in the last 168 hours. BNP (last 3 results) No results for input(s): PROBNP in the last 8760 hours. HbA1C: No results for input(s): HGBA1C in the last 72 hours. CBG: No results for input(s): GLUCAP in the last 168 hours. Lipid Profile: Recent Labs    12/20/19 0621  TRIG 200*   Thyroid Function Tests: Recent Labs    12/22/19 1209  TSH 0.435   Anemia Panel: Recent Labs    12/20/19 0621 12/21/19 0607  FERRITIN 904* 928*   Urine analysis: No results found for: COLORURINE, APPEARANCEUR, LABSPEC, PHURINE, GLUCOSEU, HGBUR, BILIRUBINUR, KETONESUR, PROTEINUR, UROBILINOGEN, NITRITE, LEUKOCYTESUR  Radiological Exams on Admission: DG Chest Port 1 View  Result Date: 12/22/2019 CLINICAL DATA:  Chest pain, COVID positive EXAM: PORTABLE CHEST 1 VIEW COMPARISON:  Chest radiograph dated 12/20/2019 FINDINGS: The heart size and mediastinal contours are within normal limits. Mild airspace opacities in the lower lungs, left greater than right, have increased since 12/20/2019. There is no  pleural effusion or pneumothorax. The visualized skeletal structures are unremarkable. IMPRESSION: Mild airspace opacities in the lower lungs, left greater than right, have increased since 12/20/2019. Electronically Signed   By: Zerita Boers M.D.   On: 12/22/2019 13:11    EKG: Independently reviewed.  Atrial flutter.  Denies elevations or depression  Assessment/Plan  Paroxysmal atrial fibrillation/flutter with RVR -Patient does have history of palpitations.  Takes metoprolol 100 mg twice a day.  Does not routinely see cardiology. -Presented with A. fib with RVR and given Cardizem in the ED and started on Cardizem drip.  Continue Cardizem drip.  Will also resume metoprolol. -Cardiology eval in AM.  2D echo.  CHA2DS2-VASc score of 1  Chest pain -Probably from above.  High-sensitivity troponin only 23.  Check repeat troponin.  EKG showed no ST elevation or depression.  Currently no chest pain.  Monitor.  Recent diagnosis of COVID-19 pneumonia with concern for bacterial infection as well -Patient was admitted on 12/20/2019 and discharged on 12/21/2019 for COVID-19 pneumonia and was also getting antibiotics for elevated procalcitonin and was discharged on Augmentin/Decadron and outpatient remdesivir -Chest x-ray on presentation shows mild airspace opacities in both lower lungs.  Currently on room air -Will check inflammatory markers in a.m.  Resume Decadron IV.  Continue remdesivir and Augmentin  Chronic kidney disease stage IV -Baseline creatinine 3-3.4.  Presented with creatinine of 4.66 during recent hospitalization.  Creatinine 3.49 today.  Monitor  Essential hypertension -Stable.  Monitor blood pressure.  Continue metoprolol and Cardizem for now.  Lasix has remained on hold during recent hospitalization  Anxiety/depression -Continue home Wellbutrin, Lexapro and Xanax  GERD -Continue PPI    DVT prophylaxis: Heparin subcutaneous Code Status: Full Family Communication: Patient at  bedside Disposition Plan: Home in 1 to 2 days once clinically improved Consults called: Consult cardiology in a.m. Admission status: Inpatient/stepdown  Severity of Illness: The appropriate patient status for this patient is OBSERVATION. Observation status is judged to be reasonable and necessary in order to provide the required intensity of service to ensure the patient's safety. The patient's presenting symptoms, physical exam findings, and initial radiographic and laboratory data in the context of their medical condition is felt to place them at decreased risk for further clinical deterioration. Furthermore, it is anticipated that the patient will be medically stable for discharge from the hospital within 2 midnights  of admission. The following factors support the patient status of observation.   " The patient's presenting symptoms include chest pain/palpitations. " The physical exam findings include tachycardia/crackles. " The initial radiographic and laboratory data are bilateral infiltrates in the bases.      Aline August MD [facility]  12/22/2019, 2:05 PM

## 2019-12-23 ENCOUNTER — Inpatient Hospital Stay (HOSPITAL_COMMUNITY): Payer: 59

## 2019-12-23 ENCOUNTER — Ambulatory Visit (HOSPITAL_COMMUNITY): Payer: 59 | Attending: Pulmonary Disease

## 2019-12-23 DIAGNOSIS — I4891 Unspecified atrial fibrillation: Secondary | ICD-10-CM

## 2019-12-23 LAB — D-DIMER, QUANTITATIVE: D-Dimer, Quant: 0.28 ug/mL-FEU (ref 0.00–0.50)

## 2019-12-23 LAB — CBC WITH DIFFERENTIAL/PLATELET
Abs Immature Granulocytes: 0.09 10*3/uL — ABNORMAL HIGH (ref 0.00–0.07)
Basophils Absolute: 0 10*3/uL (ref 0.0–0.1)
Basophils Relative: 0 %
Eosinophils Absolute: 0 10*3/uL (ref 0.0–0.5)
Eosinophils Relative: 0 %
HCT: 43.6 % (ref 39.0–52.0)
Hemoglobin: 13.8 g/dL (ref 13.0–17.0)
Immature Granulocytes: 2 %
Lymphocytes Relative: 11 %
Lymphs Abs: 0.5 10*3/uL — ABNORMAL LOW (ref 0.7–4.0)
MCH: 28.5 pg (ref 26.0–34.0)
MCHC: 31.7 g/dL (ref 30.0–36.0)
MCV: 89.9 fL (ref 80.0–100.0)
Monocytes Absolute: 0.4 10*3/uL (ref 0.1–1.0)
Monocytes Relative: 9 %
Neutro Abs: 3.4 10*3/uL (ref 1.7–7.7)
Neutrophils Relative %: 78 %
Platelets: 178 10*3/uL (ref 150–400)
RBC: 4.85 MIL/uL (ref 4.22–5.81)
RDW: 14 % (ref 11.5–15.5)
WBC: 4.3 10*3/uL (ref 4.0–10.5)
nRBC: 0 % (ref 0.0–0.2)

## 2019-12-23 LAB — ECHOCARDIOGRAM COMPLETE
AR max vel: 3.31 cm2
AV Area VTI: 3.25 cm2
AV Area mean vel: 3.13 cm2
AV Mean grad: 1.8 mmHg
AV Peak grad: 3.9 mmHg
Ao pk vel: 0.99 m/s
Area-P 1/2: 4.06 cm2
Height: 70 in
S' Lateral: 3.54 cm
Weight: 5319.26 oz

## 2019-12-23 LAB — COMPREHENSIVE METABOLIC PANEL
ALT: 34 U/L (ref 0–44)
AST: 32 U/L (ref 15–41)
Albumin: 3.1 g/dL — ABNORMAL LOW (ref 3.5–5.0)
Alkaline Phosphatase: 33 U/L — ABNORMAL LOW (ref 38–126)
Anion gap: 11 (ref 5–15)
BUN: 60 mg/dL — ABNORMAL HIGH (ref 6–20)
CO2: 21 mmol/L — ABNORMAL LOW (ref 22–32)
Calcium: 8.4 mg/dL — ABNORMAL LOW (ref 8.9–10.3)
Chloride: 109 mmol/L (ref 98–111)
Creatinine, Ser: 3.45 mg/dL — ABNORMAL HIGH (ref 0.61–1.24)
GFR calc Af Amer: 23 mL/min — ABNORMAL LOW (ref >60)
GFR calc non Af Amer: 20 mL/min — ABNORMAL LOW (ref >60)
Glucose, Bld: 134 mg/dL — ABNORMAL HIGH (ref 70–99)
Potassium: 4.3 mmol/L (ref 3.5–5.1)
Sodium: 141 mmol/L (ref 135–145)
Total Bilirubin: 0.7 mg/dL (ref 0.3–1.2)
Total Protein: 6.1 g/dL — ABNORMAL LOW (ref 6.5–8.1)

## 2019-12-23 LAB — MAGNESIUM: Magnesium: 2.6 mg/dL — ABNORMAL HIGH (ref 1.7–2.4)

## 2019-12-23 LAB — C-REACTIVE PROTEIN: CRP: 7.5 mg/dL — ABNORMAL HIGH (ref <1.0)

## 2019-12-23 LAB — MRSA PCR SCREENING: MRSA by PCR: NEGATIVE

## 2019-12-23 LAB — FERRITIN: Ferritin: 905 ng/mL — ABNORMAL HIGH (ref 24–336)

## 2019-12-23 LAB — LACTATE DEHYDROGENASE: LDH: 229 U/L — ABNORMAL HIGH (ref 98–192)

## 2019-12-23 MED ORDER — ENOXAPARIN SODIUM 80 MG/0.8ML ~~LOC~~ SOLN
75.0000 mg | SUBCUTANEOUS | Status: DC
  Administered 2019-12-23: 75 mg via SUBCUTANEOUS
  Filled 2019-12-23: qty 0.8

## 2019-12-23 MED ORDER — DILTIAZEM HCL 60 MG PO TABS
60.0000 mg | ORAL_TABLET | Freq: Three times a day (TID) | ORAL | Status: DC
  Administered 2019-12-23 (×3): 60 mg via ORAL
  Filled 2019-12-23 (×3): qty 1

## 2019-12-23 MED ORDER — CHLORHEXIDINE GLUCONATE CLOTH 2 % EX PADS
6.0000 | MEDICATED_PAD | Freq: Every day | CUTANEOUS | Status: DC
  Administered 2019-12-23 – 2019-12-24 (×2): 6 via TOPICAL

## 2019-12-23 NOTE — Progress Notes (Signed)
Consult received for aflutter with RVR in COVID + patient. Chart and data reviewed, due to the limited focus of the consultation and in order to limit unneccesary direct patient contacts of a COVID + patient we have evaluted him peripherally  History of CKD IV, HTN, HL admitted initially 12/20/19 with fevers, cough, SOB. COVID +, managed in hospital and discharged 12/21/19. Presented again yesterday with chest pain, found to be in afib with RVR. Started on dilt gtt for rate ctonrol   K 3.49 BUN 54 WBC 9.9 Plt 233 TSH 0.4 hstrop 23-->22 EKG aflutter CXR: bilateral lower lung opacities  Presents with new onset aflutter in setting of COVID+ pneumonia. Started on diltiazem drip, currnetly in rate controlled aflutter on dilt gtt at 5 mg/hr. BP's are fine. Start oral dilt 60mg  tid and wean dilt gtt. Continue his home lopressor 100mg  bid   CHADS2Vasc score is 1 (HTN), echo pending so could possibly change. If does not self convert this admission would start anticoag in anticipation of cardioversion as outpatient in 3 weeks, then continue anticoag additional 4 weeks after and the could stop.  Very mild flat troponin in setting of aflutter with RVR, COVID. No plans for ischemic testing, f/u echo.    Carlyle Dolly MD

## 2019-12-23 NOTE — Progress Notes (Signed)
PROGRESS NOTE    Christopher Boyer  POE:[id number] DOB: 10-14-1970 DOA: 12/22/2019 PCP: [facility]   Brief Narrative:  Per HPI: Christopher Boyer is a 49 y.o. male with medical history significant of morbid obesity, generalized anxiety disorder, CKD stage IV, hypertension, hyperlipidemia, vitamin D deficiency, recent diagnosis of COVID-19 pneumonia for which he was hospitalized on 12/20/2019 and discharged on 12/21/2019 on oral Decadron and outpatient remdesivir infusion presented today with chest pain.  Patient states that he woke up this morning with chest pain in the mid chest area, sharp in nature, 5-7 out of 10 in intensity, no relieving or aggravating factors along with palpitations.  Denies any nausea, vomiting.  Felt anxious and had increased cough.  Denies any trauma to chest.  No loss of consciousness or syncope.  He was discharged yesterday and was supposed to be taking oral Decadron and antibiotics but has not filled this prescription yet.  He states that his metoprolol dose was 'messed up' during the hospitalization.  Patient denies any decrease in urine output or swelling of his legs.  Patient has history of palpitations in the past.  8/30: Patient recently discharged with Covid pneumonia and has returned with atrial fibrillation with RVR and has been started on Cardizem drip.  Cardiology planning to wean drip today and initiate diltiazem 60 mg 3 times daily.  2D echocardiogram pending.  Assessment & Plan:   Principal Problem:   Atrial fibrillation with RVR (HCC) Active Problems:   Pneumonia due to COVID-19 virus   CKD (chronic kidney disease), stage IV (HCC)   Benign essential HTN   Anxiety   Paroxysmal atrial fibrillation/flutter with RVR -Patient does have history of palpitations.  Takes metoprolol 100 mg twice a day.  Does not routinely see cardiology. -Appreciate cardiology evaluation to start diltiazem 60 mg 3 times daily today -Wean  diltiazem drip as tolerated -TSH 0.435 -2D echocardiogram ordered and pending -Chads vas score of 1 with no need for anticoagulation at this point  Chest pain-resolved -Likely related to some mild demand ischemia with troponin trend flat  Recent diagnosis of COVID-19 pneumonia with concern for bacterial infection as well -Patient was admitted on 12/20/2019 and discharged on 12/21/2019 for COVID-19 pneumonia and was also getting antibiotics for elevated procalcitonin and was discharged on Augmentin/Decadron and outpatient remdesivir -Chest x-ray on presentation shows mild airspace opacities in both lower lungs.  Currently on room air -Will check inflammatory markers in a.m.  Resume Decadron IV.  Continue remdesivir and Augmentin  Chronic kidney disease stage IV -Baseline creatinine 3-3.4.  Presented with creatinine of 4.66 during recent hospitalization.  Creatinine 3.45 today.  Monitor -Appears to be at baseline  Essential hypertension -Stable.  Monitor blood pressure.  Continue metoprolol and Cardizem for now.  Lasix has remained on hold during recent hospitalization  Anxiety/depression -Continue home Wellbutrin, Lexapro and Xanax  GERD -Continue PPI   DVT prophylaxis: Heparin Code Status: Full Family Communication: None at bedside Disposition Plan:  Status is: Inpatient  Remains inpatient appropriate because:IV treatments appropriate due to intensity of illness or inability to take PO and Inpatient level of care appropriate due to severity of illness   Dispo: The patient is from: Home              Anticipated d/c is to: Home              Anticipated d/c date is: 1 day  Patient currently is not medically stable to d/c.  Consultants:   Cardiology  Procedures:   See below  Antimicrobials:  Anti-infectives (From admission, onward)   Start     Dose/Rate Route Frequency Ordered Stop   12/22/19 2245  remdesivir 100 mg in sodium chloride 0.9 % 100 mL IVPB   Status:  Discontinued        100 mg 200 mL/hr over 30 Minutes Intravenous  Once 12/22/19 2236 12/22/19 2249   12/22/19 2245  remdesivir 100 mg in sodium chloride 0.9 % 100 mL IVPB  Status:  Discontinued        100 mg 200 mL/hr over 30 Minutes Intravenous  Once 12/22/19 2236 12/22/19 2249   12/22/19 2245  remdesivir 100 mg in sodium chloride 0.9 % 100 mL IVPB  Status:  Discontinued        100 mg 200 mL/hr over 30 Minutes Intravenous  Once 12/22/19 2236 12/22/19 2249   12/22/19 1800  amoxicillin-clavulanate (AUGMENTIN) 500-125 MG per tablet 500 mg        1 tablet Oral 2 times daily 12/22/19 1404 12/27/19 0959   12/22/19 1500  remdesivir 100 mg in sodium chloride 0.9 % 100 mL IVPB        100 mg 200 mL/hr over 30 Minutes Intravenous Daily 12/22/19 1413 12/25/19 0959       Subjective: Patient seen and evaluated today with no new acute complaints or concerns. No acute concerns or events noted overnight.  Objective: Vitals:   12/23/19 0645 12/23/19 0700 12/23/19 0800 12/23/19 0900  BP: (!) 151/86 122/83 118/90 (!) 158/104  Pulse: 70 77 72 77  Resp: (!) 25 (!) 21 (!) 22 18  Temp:      TempSrc:      SpO2: 93% 93% 91% 97%  Weight:      Height:        Intake/Output Summary (Last 24 hours) at 12/23/2019 1008 Last data filed at 12/23/2019 0736 Gross per 24 hour  Intake 224.36 ml  Output --  Net 224.36 ml   Filed Weights   12/22/19 1142 12/23/19 0419  Weight: (!) 158.8 kg (!) (P) 150.8 kg    Examination:  General exam: Appears calm and comfortable, obese Respiratory system: Clear to auscultation. Respiratory effort normal.  Currently on 2 L nasal cannula oxygen. Cardiovascular system: S1 & S2 heard, RRR.  Gastrointestinal system: Abdomen is nondistended, soft and nontender.  Central nervous system: Alert and oriented. No focal neurological deficits. Extremities: Symmetric 5 x 5 power. Skin: No rashes, lesions or ulcers Psychiatry: Judgement and insight appear normal. Mood &  affect appropriate.     Data Reviewed: I have personally reviewed following labs and imaging studies  CBC: Recent Labs  Lab 12/20/19 0621 12/21/19 0607 12/22/19 1154 12/23/19 0847  WBC 3.1* 3.4* 9.9 4.3  NEUTROABS 2.3 2.8  --  3.4  HGB 13.4 13.5 14.4 13.8  HCT 41.5 41.8 43.8 43.6  MCV 89.8 89.7 87.3 89.9  PLT 135* 144* 233 573   Basic Metabolic Panel: Recent Labs  Lab 12/20/19 0621 12/21/19 0607 12/22/19 1154 12/23/19 0847  NA 133* 136 137 141  K 4.3 4.0 4.0 4.3  CL 103 104 108 109  CO2 19* 21* 19* 21*  GLUCOSE 117* 165* 121* 134*  BUN 49* 52* 54* 60*  CREATININE 4.66* 4.06* 3.49* 3.45*  CALCIUM 8.7* 8.0* 8.5* 8.4*  MG  --   --   --  2.6*   GFR: Estimated Creatinine Clearance: 40.2  mL/min (A) (by C-G formula based on SCr of 3.45 mg/dL (H)). Liver Function Tests: Recent Labs  Lab 12/20/19 0621 12/21/19 0607 12/23/19 0847  AST 53* 45* 32  ALT 42 43 34  ALKPHOS 37* 33* 33*  BILITOT 0.6 0.6 0.7  PROT 6.6 6.0* 6.1*  ALBUMIN 3.6 3.1* 3.1*   No results for input(s): LIPASE, AMYLASE in the last 168 hours. No results for input(s): AMMONIA in the last 168 hours. Coagulation Profile: No results for input(s): INR, PROTIME in the last 168 hours. Cardiac Enzymes: No results for input(s): CKTOTAL, CKMB, CKMBINDEX, TROPONINI in the last 168 hours. BNP (last 3 results) No results for input(s): PROBNP in the last 8760 hours. HbA1C: No results for input(s): HGBA1C in the last 72 hours. CBG: No results for input(s): GLUCAP in the last 168 hours. Lipid Profile: No results for input(s): CHOL, HDL, LDLCALC, TRIG, CHOLHDL, LDLDIRECT in the last 72 hours. Thyroid Function Tests: Recent Labs    12/22/19 1209  TSH 0.435   Anemia Panel: Recent Labs    12/21/19 0607 12/23/19 0847  FERRITIN 928* 905*   Sepsis Labs: Recent Labs  Lab 12/20/19 0621 12/21/19 0607  PROCALCITON 0.56 0.36  LATICACIDVEN 1.0  --     Recent Results (from the past 240 hour(s))  SARS  Coronavirus 2 by RT PCR (hospital order, performed in [facility] hospital lab) Nasopharyngeal Nasopharyngeal Swab     Status: Abnormal   Collection Time: 12/20/19  5:22 AM   Specimen: Nasopharyngeal Swab  Result Value Ref Range Status   SARS Coronavirus 2 POSITIVE (A) NEGATIVE Final    Comment: RESULT CALLED TO, READ BACK BY AND VERIFIED WITH: CRAWFORD,H AT 1010 ON 12/20/19 BY HUFFINES,S (NOTE) SARS-CoV-2 target nucleic acids are DETECTED  SARS-CoV-2 RNA is generally detectable in upper respiratory specimens  during the acute phase of infection.  Positive results are indicative  of the presence of the identified virus, but do not rule out bacterial infection or co-infection with other pathogens not detected by the test.  Clinical correlation with patient history and  other diagnostic information is necessary to determine patient infection status.  The expected result is negative.  Fact Sheet for Patients:   StrictlyIdeas.no   Fact Sheet for Healthcare Providers:   BankingDealers.co.za    This test is not yet approved or cleared by the [place] FDA and  has been authorized for detection and/or diagnosis of SARS-CoV-2 by FDA under an Emergency Use Authorization (EUA).  This EUA will remain in effect (meani ng this test can be used) for the duration of  the COVID-19 declaration under Section 564(b)(1) of the Act, 21 U.S.C. section 360-bbb-3(b)(1), unless the authorization is terminated or revoked sooner.  Performed at [facility], [address] [place], [place] [address]   Blood Culture (routine x 2)     Status: None (Preliminary result)   Collection Time: 12/20/19  6:11 AM   Specimen: BLOOD  Result Value Ref Range Status   Specimen Description BLOOD LEFT ANTECUBITAL  Final   Special Requests   Final    BOTTLES DRAWN AEROBIC AND ANAEROBIC Blood Culture adequate volume   Culture   Final    NO GROWTH 3 DAYS Performed at [facility], [address], [place], [place] [address]    Report Status PENDING  Incomplete  Blood Culture (routine x 2)     Status: None (Preliminary result)   Collection Time: 12/20/19  6:21 AM   Specimen: BLOOD  Result Value  Ref Range Status   Specimen Description BLOOD LEFT ANTECUBITAL  Final   Special Requests   Final    BOTTLES DRAWN AEROBIC AND ANAEROBIC Blood Culture adequate volume   Culture   Final    NO GROWTH 3 DAYS Performed at [facility], [address], [place], [place] [address]    Report Status PENDING  Incomplete  Gastrointestinal Panel by PCR , Stool     Status: None   Collection Time: 12/20/19 10:49 AM   Specimen: Stool  Result Value Ref Range Status   Campylobacter species NOT DETECTED NOT DETECTED Final   Plesimonas shigelloides NOT DETECTED NOT DETECTED Final   Salmonella species NOT DETECTED NOT DETECTED Final   Yersinia enterocolitica NOT DETECTED NOT DETECTED Final   Vibrio species NOT DETECTED NOT DETECTED Final   Vibrio cholerae NOT DETECTED NOT DETECTED Final   Enteroaggregative E coli (EAEC) NOT DETECTED NOT DETECTED Final   Enteropathogenic E coli (EPEC) NOT DETECTED NOT DETECTED Final   Enterotoxigenic E coli (ETEC) NOT DETECTED NOT DETECTED Final   Shiga like toxin producing E coli (STEC) NOT DETECTED NOT DETECTED Final   Shigella/Enteroinvasive E coli (EIEC) NOT DETECTED NOT DETECTED Final   Cryptosporidium NOT DETECTED NOT DETECTED Final   Cyclospora cayetanensis NOT DETECTED NOT DETECTED Final   Entamoeba histolytica NOT DETECTED NOT DETECTED Final   Giardia lamblia NOT DETECTED NOT DETECTED Final   Adenovirus F40/41 NOT DETECTED NOT DETECTED Final   Astrovirus NOT DETECTED NOT DETECTED Final   Norovirus GI/GII NOT DETECTED NOT DETECTED Final   Rotavirus A NOT DETECTED NOT DETECTED Final   Sapovirus (I, II, IV, and V) NOT DETECTED NOT DETECTED Final    Comment: Performed at [facility], [address] [place], [place] [address]  C  Difficile Quick Screen w PCR reflex     Status: None   Collection Time: 12/20/19 10:49 AM   Specimen: Stool  Result Value Ref Range Status   C Diff antigen NEGATIVE NEGATIVE Final   C Diff toxin NEGATIVE NEGATIVE Final   C Diff interpretation No C. difficile detected.  Final    Comment: Performed at [facility], [address], [place], [place] [address]         Radiology Studies: [facility] Chest [facility] 1 View  Result Date: 12/22/2019 CLINICAL DATA:  Chest pain, COVID positive EXAM: PORTABLE CHEST 1 VIEW COMPARISON:  Chest radiograph dated 12/20/2019 FINDINGS: The heart size and mediastinal contours are within normal limits. Mild airspace opacities in the lower lungs, left greater than right, have increased since 12/20/2019. There is no pleural effusion or pneumothorax. The visualized skeletal structures are unremarkable. IMPRESSION: Mild airspace opacities in the lower lungs, left greater than right, have increased since 12/20/2019. Electronically Signed   By: Zerita Boers M.D.   On: 12/22/2019 13:11        Scheduled Meds: . ALPRAZolam  1 mg Oral QHS  . amoxicillin-clavulanate  1 tablet Oral BID  . ascorbic acid  500 mg Oral Daily  . buPROPion  300 mg Oral Daily  . Chlorhexidine Gluconate Cloth  6 each Topical Daily  . dexamethasone (DECADRON) injection  6 mg Intravenous Q24H  . diltiazem  60 mg Oral TID  . enoxaparin (LOVENOX) injection  75 mg Subcutaneous Q24H  . escitalopram  10 mg Oral Daily  . fenofibrate  160 mg Oral Daily  . mouth rinse  15 mL Mouth Rinse BID  . metoprolol tartrate  100 mg Oral BID  .  pantoprazole  40 mg Oral Daily  . zinc sulfate  220 mg Oral Daily   Continuous Infusions: . diltiazem (CARDIZEM) infusion 5 mg/hr (12/23/19 0736)  . remdesivir 100 mg in NS 100 mL 100 mg (12/23/19 0848)     LOS: 1 day    Time spent: 35 minutes    Abdulahi Schor D Manuella Ghazi, DO [facility]  If 7PM-7AM, please contact night-coverage [email or web address] 12/23/2019, 10:08 AM

## 2019-12-23 NOTE — Progress Notes (Signed)
Cardizem gtt stopped at 1013. HR well controlled in the 70's. Pt has no complaints. Will continue to monitor.

## 2019-12-23 NOTE — Progress Notes (Signed)
*  PRELIMINARY RESULTS* Echocardiogram 2D Echocardiogram has been performed.  Leavy Cella 12/23/2019, 2:40 PM

## 2019-12-24 ENCOUNTER — Ambulatory Visit (HOSPITAL_COMMUNITY): Admit: 2019-12-24 | Payer: 59

## 2019-12-24 LAB — COMPREHENSIVE METABOLIC PANEL
ALT: 36 U/L (ref 0–44)
AST: 31 U/L (ref 15–41)
Albumin: 2.8 g/dL — ABNORMAL LOW (ref 3.5–5.0)
Alkaline Phosphatase: 31 U/L — ABNORMAL LOW (ref 38–126)
Anion gap: 11 (ref 5–15)
BUN: 59 mg/dL — ABNORMAL HIGH (ref 6–20)
CO2: 21 mmol/L — ABNORMAL LOW (ref 22–32)
Calcium: 8.4 mg/dL — ABNORMAL LOW (ref 8.9–10.3)
Chloride: 107 mmol/L (ref 98–111)
Creatinine, Ser: 3.19 mg/dL — ABNORMAL HIGH (ref 0.61–1.24)
GFR calc Af Amer: 25 mL/min — ABNORMAL LOW (ref >60)
GFR calc non Af Amer: 22 mL/min — ABNORMAL LOW (ref >60)
Glucose, Bld: 140 mg/dL — ABNORMAL HIGH (ref 70–99)
Potassium: 4.5 mmol/L (ref 3.5–5.1)
Sodium: 139 mmol/L (ref 135–145)
Total Bilirubin: 0.6 mg/dL (ref 0.3–1.2)
Total Protein: 5.9 g/dL — ABNORMAL LOW (ref 6.5–8.1)

## 2019-12-24 LAB — CBC WITH DIFFERENTIAL/PLATELET
Abs Immature Granulocytes: 0.22 10*3/uL — ABNORMAL HIGH (ref 0.00–0.07)
Basophils Absolute: 0 10*3/uL (ref 0.0–0.1)
Basophils Relative: 1 %
Eosinophils Absolute: 0 10*3/uL (ref 0.0–0.5)
Eosinophils Relative: 0 %
HCT: 40.6 % (ref 39.0–52.0)
Hemoglobin: 13.1 g/dL (ref 13.0–17.0)
Immature Granulocytes: 5 %
Lymphocytes Relative: 10 %
Lymphs Abs: 0.5 10*3/uL — ABNORMAL LOW (ref 0.7–4.0)
MCH: 29 pg (ref 26.0–34.0)
MCHC: 32.3 g/dL (ref 30.0–36.0)
MCV: 90 fL (ref 80.0–100.0)
Monocytes Absolute: 0.3 10*3/uL (ref 0.1–1.0)
Monocytes Relative: 7 %
Neutro Abs: 3.6 10*3/uL (ref 1.7–7.7)
Neutrophils Relative %: 77 %
Platelets: 213 10*3/uL (ref 150–400)
RBC: 4.51 MIL/uL (ref 4.22–5.81)
RDW: 14 % (ref 11.5–15.5)
WBC: 4.6 10*3/uL (ref 4.0–10.5)
nRBC: 0 % (ref 0.0–0.2)

## 2019-12-24 LAB — MAGNESIUM: Magnesium: 2.5 mg/dL — ABNORMAL HIGH (ref 1.7–2.4)

## 2019-12-24 LAB — C-REACTIVE PROTEIN: CRP: 2.8 mg/dL — ABNORMAL HIGH (ref <1.0)

## 2019-12-24 LAB — LACTATE DEHYDROGENASE: LDH: 206 U/L — ABNORMAL HIGH (ref 98–192)

## 2019-12-24 LAB — FERRITIN: Ferritin: 775 ng/mL — ABNORMAL HIGH (ref 24–336)

## 2019-12-24 LAB — D-DIMER, QUANTITATIVE: D-Dimer, Quant: <0.27 ug/mL-FEU (ref 0.00–0.50)

## 2019-12-24 MED ORDER — GUAIFENESIN-DM 100-10 MG/5ML PO SYRP: 10.0000 mL | ORAL_SOLUTION | ORAL | 0 refills | Status: DC | PRN

## 2019-12-24 MED ORDER — APIXABAN 5 MG PO TABS: 5.0000 mg | ORAL_TABLET | Freq: Two times a day (BID) | ORAL | 3 refills | Status: DC

## 2019-12-24 MED ORDER — DILTIAZEM HCL ER COATED BEADS 180 MG PO CP24
180.0000 mg | ORAL_CAPSULE | Freq: Every day | ORAL | Status: DC
  Administered 2019-12-24: 180 mg via ORAL
  Filled 2019-12-24: qty 1

## 2019-12-24 MED ORDER — DILTIAZEM HCL ER COATED BEADS 180 MG PO CP24: 180.0000 mg | ORAL_CAPSULE | Freq: Every day | ORAL | 3 refills | Status: DC

## 2019-12-24 MED ORDER — APIXABAN 5 MG PO TABS
5.0000 mg | ORAL_TABLET | Freq: Two times a day (BID) | ORAL | Status: DC
  Administered 2019-12-24: 5 mg via ORAL
  Filled 2019-12-24: qty 1

## 2019-12-24 NOTE — Progress Notes (Signed)
Pt has been off of oxygen since 0830. Ambulated in his room personally reviewed by RN and lowest O2 sat dropped was 90%. Pt has no complaints and is ready to go home. MD made aware. Will continue to monitor.

## 2019-12-24 NOTE — Progress Notes (Signed)
Nsg Discharge Note  Admit Date:  12/22/2019 Discharge date: 12/24/2019   Christopher Boyer to be D/C'd Home per MD order.  AVS completed.  Copy for chart, and copy for patient signed, and dated. Patient/caregiver able to verbalize understanding.  Discharge Medication: Allergies as of 12/24/2019      Reactions   Septra [sulfamethoxazole-trimethoprim] Rash      Medication List    TAKE these medications   ALPRAZolam 0.5 MG tablet Commonly known as: XANAX Take 1 mg by mouth at bedtime.   apixaban 5 MG Tabs tablet Commonly known as: ELIQUIS Take 1 tablet (5 mg total) by mouth 2 (two) times daily.   ascorbic acid 500 MG tablet Commonly known as: VITAMIN C Take 500 mg by mouth daily.   buPROPion 300 MG 24 hr tablet Commonly known as: WELLBUTRIN XL Take 300 mg by mouth daily.   cetirizine 10 MG tablet Commonly known as: ZYRTEC Take 10 mg by mouth daily.   Cholecalciferol 25 MCG (1000 UT) tablet Take 1,000 Units by mouth daily.   diltiazem 180 MG 24 hr capsule Commonly known as: CARDIZEM CD Take 1 capsule (180 mg total) by mouth daily.   escitalopram 10 MG tablet Commonly known as: LEXAPRO Take 10 mg by mouth daily.   escitalopram 20 MG tablet Commonly known as: LEXAPRO Take 20 mg by mouth See admin instructions.   fenofibrate 160 MG tablet Take 160 mg by mouth daily.   guaiFENesin-dextromethorphan 100-10 MG/5ML syrup Commonly known as: ROBITUSSIN DM Take 10 mLs by mouth every 4 (four) hours as needed for cough.   loperamide 2 MG capsule Commonly known as: IMODIUM Take 1 capsule (2 mg total) by mouth as needed for diarrhea or loose stools.   metoprolol tartrate 100 MG tablet Commonly known as: LOPRESSOR Take 100 mg by mouth 2 (two) times daily.   omeprazole 20 MG capsule Commonly known as: PRILOSEC Take 20 mg by mouth daily.   triamcinolone ointment 0.5 % Commonly known as: KENALOG Apply 1 application topically daily as needed.       Discharge  Assessment: Vitals:   12/24/19 1000 12/24/19 1045  BP: 137/88   Pulse: 72 74  Resp: 20 (!) 23  Temp:    SpO2: 91% 93%   Skin clean, dry and intact without evidence of skin break down, no evidence of skin tears noted. IV catheter discontinued intact. Site without signs and symptoms of complications - no redness or edema noted at insertion site, patient denies c/o pain - only slight tenderness at site.  Dressing with slight pressure applied.  D/c Instructions-Education: Discharge instructions given to patient/family with verbalized understanding. D/c education completed with patient/family including follow up instructions, medication list, d/c activities limitations if indicated, with other d/c instructions as indicated by MD - patient able to verbalize understanding, all questions fully answered. Patient instructed to return to ED, call 911, or call MD for any changes in condition.  Patient escorted via [place], and D/C home via private auto.  Carney Corners, RN 12/24/2019 12:19 PM

## 2019-12-24 NOTE — Discharge Summary (Signed)
Physician Discharge Summary  Christopher Boyer AST:[id number] DOB: [birth year] DOA: 12/22/2019  PCP: [facility]  Admit date: 12/22/2019  Discharge date: 12/24/2019  Admitted From:Home  Disposition:  Home  Recommendations for Outpatient Follow-up:  1. Follow up with PCP in 1-2 weeks 2. Follow-up with cardiology as scheduled on 9/13 at 11:30 AM to consider cardioversion 3. Please obtain BMP/CBC in one week 4. Continue on Augmentin and Decadron as previously arranged 5. Follow-up for 1 more remdesivir infusion to complete total 5-day course as previously arranged 6. Continue on Cardizem 180 mg CD as prescribed as well as metoprolol as previously 7. Continue Eliquis 5 mg twice daily for anticoagulation   Home Health:None  Equipment/Devices: None  Discharge Condition:Stable  CODE STATUS: Full  Diet recommendation: Heart Healthy  Brief/Interim Summary: Per HPI: Demontay Grantham Sheltonis a 49 y.o.malewith medical history significant ofmorbid obesity, generalized anxiety disorder, CKD stage IV, hypertension, hyperlipidemia, vitamin D deficiency, recent diagnosis of COVID-19 pneumonia for which he was hospitalized on 8/27/2021and discharged on 12/21/2019 on oral Decadron and outpatient remdesivir infusion presented today with chest pain. Patient states that he woke up this morning with chest pain in the mid chest area, sharp in nature, 5-7 out of 10 in intensity, no relieving or aggravating factors along with palpitations. Denies any nausea, vomiting. Felt anxious and had increased cough. Denies any trauma to chest. No loss of consciousness or syncope. He was discharged yesterday and was supposed to be taking oral Decadron and antibiotics but has not filled this prescription yet. He states that his metoprolol dose was 'messed up'during the hospitalization. Patient denies any decrease in urine output or swelling of his legs. Patient has history of  palpitations in the past.  8/30: Patient recently discharged with Covid pneumonia and has returned with atrial fibrillation with RVR and has been started on Cardizem drip.  Cardiology planning to wean drip today and initiate diltiazem 60 mg 3 times daily.  2D echocardiogram pending.  8/31: 2D echocardiogram with LVEF 55-60% with no other acute abnormalities noted.  He has been seen by cardiology with stable heart rate control noted and no further symptomatology related to his atrial fibrillation/flutter.  He will need Eliquis for anticoagulation and will be arranged for possible cardioversion in the outpatient setting.  He will continue his medications as prior and finish up a remdesivir infusion as previously arranged.  No other concerns otherwise noted.  He is stable for discharge with no other significant events noted throughout the course of this admission.  Discharge Diagnoses:  Principal Problem:   Atrial fibrillation with RVR ([place]) Active Problems:   Pneumonia due to COVID-19 virus   CKD (chronic kidney disease), stage IV (HCC)   Benign essential HTN   Anxiety  Principal discharge diagnosis: Atrial fibrillation with RVR.  Discharge Instructions  Discharge Instructions    Diet - low sodium heart healthy   Complete by: As directed    Increase activity slowly   Complete by: As directed      Allergies as of 12/24/2019      Reactions   Septra [sulfamethoxazole-trimethoprim] Rash      Medication List    TAKE these medications   ALPRAZolam 0.5 MG tablet Commonly known as: XANAX Take 1 mg by mouth at bedtime.   apixaban 5 MG Tabs tablet Commonly known as: ELIQUIS Take 1 tablet (5 mg total) by mouth 2 (two) times daily.   ascorbic acid 500 MG tablet Commonly known as: VITAMIN C Take  500 mg by mouth daily.   buPROPion 300 MG 24 hr tablet Commonly known as: WELLBUTRIN XL Take 300 mg by mouth daily.   cetirizine 10 MG tablet Commonly known as: ZYRTEC Take 10 mg by mouth  daily.   Cholecalciferol 25 MCG (1000 UT) tablet Take 1,000 Units by mouth daily.   diltiazem 180 MG 24 hr capsule Commonly known as: CARDIZEM CD Take 1 capsule (180 mg total) by mouth daily.   escitalopram 10 MG tablet Commonly known as: LEXAPRO Take 10 mg by mouth daily.   escitalopram 20 MG tablet Commonly known as: LEXAPRO Take 20 mg by mouth See admin instructions.   fenofibrate 160 MG tablet Take 160 mg by mouth daily.   guaiFENesin-dextromethorphan 100-10 MG/5ML syrup Commonly known as: ROBITUSSIN DM Take 10 mLs by mouth every 4 (four) hours as needed for cough.   loperamide 2 MG capsule Commonly known as: IMODIUM Take 1 capsule (2 mg total) by mouth as needed for diarrhea or loose stools.   metoprolol tartrate 100 MG tablet Commonly known as: LOPRESSOR Take 100 mg by mouth 2 (two) times daily.   omeprazole 20 MG capsule Commonly known as: PRILOSEC Take 20 mg by mouth daily.   triamcinolone ointment 0.5 % Commonly known as: KENALOG Apply 1 application topically daily as needed.       Follow-up Information    Verta Ellen., NP Follow up on 01/06/2020.   Specialty: Cardiology Why: [facility]iology Hospital Follow-up on 01/06/2020 at 11:30 AM. Please call the office if needing a different date or time.  Contact information: [place] [address] [place], [place] Follow up in 1 week(s).   Specialty: Family Medicine Contact information: [address] [place] [address] [phone]              Allergies  Allergen Reactions  . Septra [Sulfamethoxazole-Trimethoprim] Rash    Consultations:  Cardiology   Procedures/Studies: DG Chest Port 1 View  Result Date: 12/22/2019 CLINICAL DATA:  Chest pain, COVID positive EXAM: PORTABLE CHEST 1 VIEW COMPARISON:  Chest radiograph dated 12/20/2019 FINDINGS: The heart size and mediastinal contours are within  normal limits. Mild airspace opacities in the lower lungs, left greater than right, have increased since 12/20/2019. There is no pleural effusion or pneumothorax. The visualized skeletal structures are unremarkable. IMPRESSION: Mild airspace opacities in the lower lungs, left greater than right, have increased since 12/20/2019. Electronically Signed   By: Zerita Boers M.D.   On: 12/22/2019 13:11   DG Chest Port 1 View  Result Date: 12/20/2019 CLINICAL DATA:  None EXAM: PORTABLE CHEST 1 VIEW COMPARISON:  None. FINDINGS: The lungs are symmetrically expanded. There is a focal opacity seen at the lateral left lung base, indeterminate. This may represent a focal pneumonic infiltrate in the appropriate clinical setting. An underlying nodule is difficult to exclude. No pneumothorax or pleural effusion. Cardiac size within normal limits. Pulmonary vascularity is normal. IMPRESSION: Focal opacity at the lateral left lung base, indeterminate. This may represent a focal pneumonic infiltrate in the appropriate clinical setting. An underlying nodule is difficult to exclude. Follow-up chest radiograph is recommended to document complete resolution. Electronically Signed   By: Fidela Salisbury MD   On: 12/20/2019 03:43   ECHOCARDIOGRAM COMPLETE  Result Date: 12/23/2019    ECHOCARDIOGRAM REPORT   Patient Name:   ROMELL WOLDEN Date of Exam: 12/23/2019 Medical Rec #:  [id number]          Height:       70.0 in Accession #:    [id number]         Weight:       350.0 lb Date of Birth:  [birth year]           BSA:          2.648 m Patient Age:    89 years           BP:           139/99 mmHg Patient Gender: M                  HR:           72 bpm. Exam Location:  [facility] Procedure: 2D Echo Indications:    Atrial Fibrillation 427.31 / I48.91  History:        Patient has no prior history of Echocardiogram examinations.                 Arrythmias:Atrial Fibrillation; Risk Factors:Hypertension and                 Non-Smoker. Sepsis,  Covid Positive, Acute Renal Failure,                 Pneumonia.  Sonographer:    Leavy Cella RDCS (AE) Referring Phys: [id number] [place]  1. Left ventricular ejection fraction, by estimation, is 55 to 60%. The left ventricle has normal function. The left ventricle has no regional wall motion abnormalities. There is moderate left ventricular hypertrophy. Left ventricular diastolic parameters are indeterminate.  2. Right ventricular systolic function is normal. The right ventricular size is normal.  3. Left atrial size was mildly dilated.  4. The mitral valve is normal in structure. No evidence of mitral valve regurgitation. No evidence of mitral stenosis.  5. The aortic valve is tricuspid. Aortic valve regurgitation is not visualized. No aortic stenosis is present.  6. The inferior vena cava is normal in size with greater than 50% respiratory variability, suggesting right atrial pressure of 3 mmHg. FINDINGS  Left Ventricle: Left ventricular ejection fraction, by estimation, is 55 to 60%. The left ventricle has normal function. The left ventricle has no regional wall motion abnormalities. The left ventricular internal cavity size was normal in size. There is  moderate left ventricular hypertrophy. Left ventricular diastolic parameters are indeterminate. Right Ventricle: The right ventricular size is normal. No increase in right ventricular wall thickness. Right ventricular systolic function is normal. Left Atrium: Left atrial size was mildly dilated. Right Atrium: Right atrial size was normal in size. Pericardium: There is no evidence of pericardial effusion. Mitral Valve: The mitral valve is normal in structure. No evidence of mitral valve regurgitation. No evidence of mitral valve stenosis. Tricuspid Valve: The tricuspid valve is not well visualized. Tricuspid valve regurgitation is not demonstrated. No evidence of tricuspid stenosis. Aortic Valve: The aortic valve is tricuspid. Aortic valve  regurgitation is not visualized. No aortic stenosis is present. Aortic valve mean gradient measures 1.8 mmHg. Aortic valve peak gradient measures 3.9 mmHg. Aortic valve area, by VTI measures 3.25 cm. Pulmonic Valve: The pulmonic valve was not well visualized. Pulmonic valve regurgitation is not visualized. No evidence of pulmonic stenosis. Aorta: The aortic root is normal in size and structure. Pulmonary Artery: Indeterminant PASP, inadequate TR jet. Venous: The inferior vena cava is normal in size with greater than 50% respiratory variability, suggesting right atrial  pressure of 3 mmHg. IAS/Shunts: The interatrial septum was not well visualized.  LEFT VENTRICLE PLAX 2D LVIDd:         4.98 cm  Diastology LVIDs:         3.54 cm  LV e' lateral:   13.10 cm/s LV PW:         1.40 cm  LV E/e' lateral: 4.8 LV IVS:        1.34 cm  LV e' medial:    10.70 cm/s LVOT diam:     2.20 cm  LV E/e' medial:  5.9 LV SV:         53 LV SV Index:   20 LVOT Area:     3.80 cm  RIGHT VENTRICLE RV S prime:     10.40 cm/s TAPSE (M-mode): 2.3 cm LEFT ATRIUM             Index       RIGHT ATRIUM           Index LA diam:        4.10 cm 1.55 cm/m  RA Area:     18.40 cm LA Vol (A2C):   65.4 ml 24.70 ml/m RA Volume:   55.40 ml  20.92 ml/m LA Vol (A4C):   76.9 ml 29.04 ml/m LA Biplane Vol: 78.3 ml 29.57 ml/m  AORTIC VALVE AV Area (Vmax):    3.31 cm AV Area (Vmean):   3.13 cm AV Area (VTI):     3.25 cm AV Vmax:           99.28 cm/s AV Vmean:          61.410 cm/s AV VTI:            0.164 m AV Peak Grad:      3.9 mmHg AV Mean Grad:      1.8 mmHg LVOT Vmax:         86.55 cm/s LVOT Vmean:        50.499 cm/s LVOT VTI:          0.140 m LVOT/AV VTI ratio: 0.85  AORTA Ao Root diam: 3.60 cm MITRAL VALVE MV Area (PHT): 4.06 cm    SHUNTS MV Decel Time: 187 msec    Systemic VTI:  0.14 m MV E velocity: 63.00 cm/s  Systemic Diam: 2.20 cm MV A velocity: 48.00 cm/s MV E/A ratio:  1.31 Carlyle Dolly MD Electronically signed by Carlyle Dolly MD  Signature Date/Time: 12/23/2019/3:44:11 PM    Final     Discharge Exam: Vitals:   12/24/19 0942 12/24/19 1000  BP:  137/88  Pulse: 62 72  Resp: 17 20  Temp:    SpO2: 91% 91%   Vitals:   12/24/19 0700 12/24/19 0800 12/24/19 0942 12/24/19 1000  BP: 131/86 132/85  137/88  Pulse: 73 71 62 72  Resp: 19 11 17 20   Temp:      TempSrc:      SpO2: 93% 93% 91% 91%  Weight:      Height:        General: Pt is alert, awake, not in acute distress Cardiovascular: Irregular, S1/S2 +, no rubs, no gallops Respiratory: CTA bilaterally, no wheezing, no rhonchi Abdominal: Soft, NT, ND, bowel sounds + Extremities: no edema, no cyanosis    The results of significant diagnostics from this hospitalization (including imaging, microbiology, ancillary and laboratory) are listed below for reference.     Microbiology: Recent Results (from the past 240 hour(s))  SARS Coronavirus 2  by RT PCR (hospital order, performed in [facility] hospital lab) Nasopharyngeal Nasopharyngeal Swab     Status: Abnormal   Collection Time: 12/20/19  5:22 AM   Specimen: Nasopharyngeal Swab  Result Value Ref Range Status   SARS Coronavirus 2 POSITIVE (A) NEGATIVE Final    Comment: RESULT CALLED TO, READ BACK BY AND VERIFIED WITH: CRAWFORD,H AT 1010 ON 12/20/19 BY HUFFINES,S (NOTE) SARS-CoV-2 target nucleic acids are DETECTED  SARS-CoV-2 RNA is generally detectable in upper respiratory specimens  during the acute phase of infection.  Positive results are indicative  of the presence of the identified virus, but do not rule out bacterial infection or co-infection with other pathogens not detected by the test.  Clinical correlation with patient history and  other diagnostic information is necessary to determine patient infection status.  The expected result is negative.  Fact Sheet for Patients:   StrictlyIdeas.no   Fact Sheet for Healthcare Providers:    BankingDealers.co.za    This test is not yet approved or cleared by the [place] FDA and  has been authorized for detection and/or diagnosis of SARS-CoV-2 by FDA under an Emergency Use Authorization (EUA).  This EUA will remain in effect (meani ng this test can be used) for the duration of  the COVID-19 declaration under Section 564(b)(1) of the Act, 21 U.S.C. section 360-bbb-3(b)(1), unless the authorization is terminated or revoked sooner.  Performed at [facility], [address] [place], [place] [address]   Blood Culture (routine x 2)     Status: None (Preliminary result)   Collection Time: 12/20/19  6:11 AM   Specimen: BLOOD  Result Value Ref Range Status   Specimen Description BLOOD LEFT ANTECUBITAL  Final   Special Requests   Final    BOTTLES DRAWN AEROBIC AND ANAEROBIC Blood Culture adequate volume   Culture   Final    NO GROWTH 4 DAYS Performed at [facility], [address], [place], [place] [address]    Report Status PENDING  Incomplete  Blood Culture (routine x 2)     Status: None (Preliminary result)   Collection Time: 12/20/19  6:21 AM   Specimen: BLOOD  Result Value Ref Range Status   Specimen Description BLOOD LEFT ANTECUBITAL  Final   Special Requests   Final    BOTTLES DRAWN AEROBIC AND ANAEROBIC Blood Culture adequate volume   Culture   Final    NO GROWTH 4 DAYS Performed at [facility], [address], [place], [place] [address]    Report Status PENDING  Incomplete  Gastrointestinal Panel by PCR , Stool     Status: None   Collection Time: 12/20/19 10:49 AM   Specimen: Stool  Result Value Ref Range Status   Campylobacter species NOT DETECTED NOT DETECTED Final   Plesimonas shigelloides NOT DETECTED NOT DETECTED Final   Salmonella species NOT DETECTED NOT DETECTED Final   Yersinia enterocolitica NOT DETECTED NOT DETECTED Final   Vibrio species NOT DETECTED NOT DETECTED Final   Vibrio cholerae NOT DETECTED NOT DETECTED Final    Enteroaggregative E coli (EAEC) NOT DETECTED NOT DETECTED Final   Enteropathogenic E coli (EPEC) NOT DETECTED NOT DETECTED Final   Enterotoxigenic E coli (ETEC) NOT DETECTED NOT DETECTED Final   Shiga like toxin producing E coli (STEC) NOT DETECTED NOT DETECTED Final   Shigella/Enteroinvasive E coli (EIEC) NOT DETECTED NOT DETECTED Final   Cryptosporidium NOT DETECTED NOT DETECTED Final   Cyclospora cayetanensis NOT DETECTED NOT DETECTED Final   Entamoeba  histolytica NOT DETECTED NOT DETECTED Final   Giardia lamblia NOT DETECTED NOT DETECTED Final   Adenovirus F40/41 NOT DETECTED NOT DETECTED Final   Astrovirus NOT DETECTED NOT DETECTED Final   Norovirus GI/GII NOT DETECTED NOT DETECTED Final   Rotavirus A NOT DETECTED NOT DETECTED Final   Sapovirus (I, II, IV, and V) NOT DETECTED NOT DETECTED Final    Comment: Performed at [facility], [address] [place], [place] [address]  C Difficile Quick Screen w PCR reflex     Status: None   Collection Time: 12/20/19 10:49 AM   Specimen: Stool  Result Value Ref Range Status   C Diff antigen NEGATIVE NEGATIVE Final   C Diff toxin NEGATIVE NEGATIVE Final   C Diff interpretation No C. difficile detected.  Final    Comment: Performed at [facility], [address] [place], [place] [address]  MRSA PCR Screening     Status: None   Collection Time: 12/22/19 10:39 PM   Specimen: Nasopharyngeal  Result Value Ref Range Status   MRSA by PCR NEGATIVE NEGATIVE Final    Comment:        The GeneXpert MRSA Assay (FDA approved for NASAL specimens only), is one component of a comprehensive MRSA colonization surveillance program. It is not intended to diagnose MRSA infection nor to guide or monitor treatment for MRSA infections. Performed at [facility], [address], [place], [place] [address]      Labs: BNP (last 3 results) No results for input(s): BNP in the last 8760 hours. Basic Metabolic Panel: Recent Labs  Lab  12/20/19 0621 12/21/19 0607 12/22/19 1154 12/23/19 0847 12/24/19 0507  NA 133* 136 137 141 139  K 4.3 4.0 4.0 4.3 4.5  CL 103 104 108 109 107  CO2 19* 21* 19* 21* 21*  GLUCOSE 117* 165* 121* 134* 140*  BUN 49* 52* 54* 60* 59*  CREATININE 4.66* 4.06* 3.49* 3.45* 3.19*  CALCIUM 8.7* 8.0* 8.5* 8.4* 8.4*  MG  --   --   --  2.6* 2.5*   Liver Function Tests: Recent Labs  Lab 12/20/19 0621 12/21/19 0607 12/23/19 0847 12/24/19 0507  AST 53* 45* 32 31  ALT 42 43 34 36  ALKPHOS 37* 33* 33* 31*  BILITOT 0.6 0.6 0.7 0.6  PROT 6.6 6.0* 6.1* 5.9*  ALBUMIN 3.6 3.1* 3.1* 2.8*   No results for input(s): LIPASE, AMYLASE in the last 168 hours. No results for input(s): AMMONIA in the last 168 hours. CBC: Recent Labs  Lab 12/20/19 0621 12/21/19 0607 12/22/19 1154 12/23/19 0847 12/24/19 0507  WBC 3.1* 3.4* 9.9 4.3 4.6  NEUTROABS 2.3 2.8  --  3.4 3.6  HGB 13.4 13.5 14.4 13.8 13.1  HCT 41.5 41.8 43.8 43.6 40.6  MCV 89.8 89.7 87.3 89.9 90.0  PLT 135* 144* 233 178 213   Cardiac Enzymes: No results for input(s): CKTOTAL, CKMB, CKMBINDEX, TROPONINI in the last 168 hours. BNP: Invalid input(s): POCBNP CBG: No results for input(s): GLUCAP in the last 168 hours. D-Dimer Recent Labs    12/23/19 0847 12/24/19 0507  DDIMER 0.28 <0.27   Hgb A1c No results for input(s): HGBA1C in the last 72 hours. Lipid Profile No results for input(s): CHOL, HDL, LDLCALC, TRIG, CHOLHDL, LDLDIRECT in the last 72 hours. Thyroid function studies Recent Labs    12/22/19 1209  TSH 0.435   Anemia work up Recent Labs    12/23/19 0847 12/24/19 0507  FERRITIN 905* 775*   Urinalysis No  results found for: COLORURINE, APPEARANCEUR, [place], [place], GLUCOSEU, [place], BILIRUBINUR, [place], PROTEINUR, UROBILINOGEN, NITRITE, LEUKOCYTESUR Sepsis Labs Invalid input(s): PROCALCITONIN,  WBC,  LACTICIDVEN Microbiology Recent Results (from the past 240 hour(s))  SARS Coronavirus 2 by RT PCR (hospital  order, performed in [facility] hospital lab) Nasopharyngeal Nasopharyngeal Swab     Status: Abnormal   Collection Time: 12/20/19  5:22 AM   Specimen: Nasopharyngeal Swab  Result Value Ref Range Status   SARS Coronavirus 2 POSITIVE (A) NEGATIVE Final    Comment: RESULT CALLED TO, READ BACK BY AND VERIFIED WITH: CRAWFORD,H AT 1010 ON 12/20/19 BY HUFFINES,S (NOTE) SARS-CoV-2 target nucleic acids are DETECTED  SARS-CoV-2 RNA is generally detectable in upper respiratory specimens  during the acute phase of infection.  Positive results are indicative  of the presence of the identified virus, but do not rule out bacterial infection or co-infection with other pathogens not detected by the test.  Clinical correlation with patient history and  other diagnostic information is necessary to determine patient infection status.  The expected result is negative.  Fact Sheet for Patients:   StrictlyIdeas.no   Fact Sheet for Healthcare Providers:   BankingDealers.co.za    This test is not yet approved or cleared by the [place] FDA and  has been authorized for detection and/or diagnosis of SARS-CoV-2 by FDA under an Emergency Use Authorization (EUA).  This EUA will remain in effect (meani ng this test can be used) for the duration of  the COVID-19 declaration under Section 564(b)(1) of the Act, 21 U.S.C. section 360-bbb-3(b)(1), unless the authorization is terminated or revoked sooner.  Performed at [facility], [address], [place], [place] [address]   Blood Culture (routine x 2)     Status: None (Preliminary result)   Collection Time: 12/20/19  6:11 AM   Specimen: BLOOD  Result Value Ref Range Status   Specimen Description BLOOD LEFT ANTECUBITAL  Final   Special Requests   Final    BOTTLES DRAWN AEROBIC AND ANAEROBIC Blood Culture adequate volume   Culture   Final    NO GROWTH 4 DAYS Performed at [facility], [address]  [place], [place] [address]    Report Status PENDING  Incomplete  Blood Culture (routine x 2)     Status: None (Preliminary result)   Collection Time: 12/20/19  6:21 AM   Specimen: BLOOD  Result Value Ref Range Status   Specimen Description BLOOD LEFT ANTECUBITAL  Final   Special Requests   Final    BOTTLES DRAWN AEROBIC AND ANAEROBIC Blood Culture adequate volume   Culture   Final    NO GROWTH 4 DAYS Performed at [facility], [address], [place], [place] [address]    Report Status PENDING  Incomplete  Gastrointestinal Panel by PCR , Stool     Status: None   Collection Time: 12/20/19 10:49 AM   Specimen: Stool  Result Value Ref Range Status   Campylobacter species NOT DETECTED NOT DETECTED Final   Plesimonas shigelloides NOT DETECTED NOT DETECTED Final   Salmonella species NOT DETECTED NOT DETECTED Final   Yersinia enterocolitica NOT DETECTED NOT DETECTED Final   Vibrio species NOT DETECTED NOT DETECTED Final   Vibrio cholerae NOT DETECTED NOT DETECTED Final   Enteroaggregative E coli (EAEC) NOT DETECTED NOT DETECTED Final   Enteropathogenic E coli (EPEC) NOT DETECTED NOT DETECTED Final   Enterotoxigenic E coli (ETEC) NOT DETECTED NOT DETECTED Final   Shiga like toxin producing E coli (STEC)  NOT DETECTED NOT DETECTED Final   Shigella/Enteroinvasive E coli (EIEC) NOT DETECTED NOT DETECTED Final   Cryptosporidium NOT DETECTED NOT DETECTED Final   Cyclospora cayetanensis NOT DETECTED NOT DETECTED Final   Entamoeba histolytica NOT DETECTED NOT DETECTED Final   Giardia lamblia NOT DETECTED NOT DETECTED Final   Adenovirus F40/41 NOT DETECTED NOT DETECTED Final   Astrovirus NOT DETECTED NOT DETECTED Final   Norovirus GI/GII NOT DETECTED NOT DETECTED Final   Rotavirus A NOT DETECTED NOT DETECTED Final   Sapovirus (I, II, IV, and V) NOT DETECTED NOT DETECTED Final    Comment: Performed at [facility], [place]., [place], [place] [address]  C Difficile Quick Screen w PCR  reflex     Status: None   Collection Time: 12/20/19 10:49 AM   Specimen: Stool  Result Value Ref Range Status   C Diff antigen NEGATIVE NEGATIVE Final   C Diff toxin NEGATIVE NEGATIVE Final   C Diff interpretation No C. difficile detected.  Final    Comment: Performed at [facility], [address], [place],  [place] [address]  MRSA PCR Screening     Status: None   Collection Time: 12/22/19 10:39 PM   Specimen: Nasopharyngeal  Result Value Ref Range Status   MRSA by PCR NEGATIVE NEGATIVE Final    Comment:        The GeneXpert MRSA Assay (FDA approved for NASAL specimens only), is one component of a comprehensive MRSA colonization surveillance program. It is not intended to diagnose MRSA infection nor to guide or monitor treatment for MRSA infections. Performed at [facility], [address], [place],  [address]      Time coordinating discharge: 35 minutes  SIGNED:   Rodena Goldmann, DO [facility] 12/24/2019, 10:19 AM  If 7PM-7AM, please contact night-coverage [email or web address]

## 2019-12-24 NOTE — Progress Notes (Signed)
Telemetry reviewed, rate controlled aflutter. He is off dilt drip. On his home lopressor 100mg  bid and also dilt 60mg  tid, bp's are fine. WIll change dilt to long acting 180mg  daily. CHADS2Vasc score is 1, in anticipation of possible outpatient cardioversion will start eliquis 5mg  bid. WOuld need 3 weeks prior and 4 weeks after cardioversion, after that could discontinue given low CHADS2Vasc score. May consider outpatient monitor in the future to see if isolated aflutter in the setting of COVID pneumonia.   [place] for discharge, we will arrange outpatient f/u within 3 weeks, may need cardioversion arranged at that time.   Carlyle Dolly MD

## 2019-12-25 LAB — CULTURE, BLOOD (ROUTINE X 2)
Culture: NO GROWTH
Culture: NO GROWTH
Special Requests: ADEQUATE
Special Requests: ADEQUATE

## 2020-01-05 NOTE — Progress Notes (Signed)
Cardiology Office Note  Date: 01/06/2020   ID: Christopher Boyer, DOB Oct 14, 1970, MRN [id number]  PCP:  [facility]  Cardiologist:  No primary care provider on file. Electrophysiologist:  None   Chief Complaint: Hospital follow up   History of Present Illness: Christopher Boyer is a 49 y.o. male with a history of morbid obesity, GAD, CKD Stage IV, HTN, HLD, Vitamin D deficiency. Recent dx of COVID pneumonia hospitalized 12/20/2019.   Admitted 12/22/2019: Woke up in am with chest pain. He had been discharged the prior day due to Covid pneumonia  infection and returned with atrial fibrillation with RVR and started on Cardizem gtt. Echocardiogram EF 55-60%. He was seen by cardiology and was to have possible cardioversion in the outpatient setting.  He was continued on Cardizem 180 mg CD daily, he was started on Eliquis 5 mg p.o. twice daily.  He was discharged on metoprolol 100 mg p.o. twice daily.   He presents today for hospital follow-up stating he is feeling much better.  He denies any other Covid symptoms such as shortness of breath/dyspnea, coughing, fever, chills.  States he finished his remdesivir and prednisone.  He denies any current palpitations or arrhythmias, orthostatic symptoms, stroke or TIA-like symptoms, PND, orthopnea, claudication-like symptoms, DVT or PE-like symptoms, lower extremity edema.  He states he stopped his Eliquis due to bruising and bleeding from hemorrhoids.  He has chronic kidney disease stage IV with a creatinine of 3.19.  He follows with nephrology.  Past Medical History:  Diagnosis Date   Chronic kidney disease (CKD), stage III (moderate)    Gout due to renal impairment    Hypertension    Leg edema    Obesity     No past surgical history on file.  Current Outpatient Medications  Medication Sig Dispense Refill   ALPRAZolam (XANAX) 0.5 MG tablet Take 1 mg by mouth at bedtime.      ascorbic acid (VITAMIN C)  500 MG tablet Take 500 mg by mouth daily.      buPROPion (WELLBUTRIN XL) 300 MG 24 hr tablet Take 300 mg by mouth daily.      Cholecalciferol 25 MCG (1000 UT) tablet Take 1,000 Units by mouth daily.      diltiazem (CARDIZEM CD) 180 MG 24 hr capsule Take 1 capsule (180 mg total) by mouth daily. 30 capsule 3   escitalopram (LEXAPRO) 10 MG tablet Take 10 mg by mouth daily.     escitalopram (LEXAPRO) 20 MG tablet Take 20 mg by mouth See admin instructions.      fenofibrate 160 MG tablet Take 160 mg by mouth daily.     loperamide (IMODIUM) 2 MG capsule Take 1 capsule (2 mg total) by mouth as needed for diarrhea or loose stools. 30 capsule 0   metoprolol tartrate (LOPRESSOR) 100 MG tablet Take 100 mg by mouth 2 (two) times daily.     omeprazole (PRILOSEC) 20 MG capsule Take 20 mg by mouth daily.     triamcinolone ointment (KENALOG) 0.5 % Apply 1 application topically daily as needed.      No current facility-administered medications for this visit.   Allergies:  Septra [sulfamethoxazole-trimethoprim]   Social History: The patient  reports that he has never smoked. He has never used smokeless tobacco. He reports that he does not drink alcohol and does not use drugs.   Family History: The patient's family history includes Atrial fibrillation in his father and mother; Valvular heart disease  in his mother.   ROS:  Please see the history of present illness. Otherwise, complete review of systems is positive for none.  All other systems are reviewed and negative.   Physical Exam: VS:  BP 112/74    Pulse 72    Ht 6' (1.829 m)    Wt (!) 333 lb (151 kg)    SpO2 95%    BMI 45.16 kg/m , BMI Body mass index is 45.16 kg/m.  Wt Readings from Last 3 Encounters:  01/06/20 (!) 333 lb (151 kg)  12/24/19 (!) 328 lb 14.8 oz (149.2 kg)  12/20/19 (!) 350 lb (158.8 kg)    General: Morbidly obese patient appears comfortable at rest. Neck: Supple, no elevated JVP or carotid bruits, no thyromegaly. Lungs:  Clear to auscultation, nonlabored breathing at rest. Cardiac: Irregularly irregular and rhythm, no S3 or significant systolic murmur, no pericardial rub. Extremities: No pitting edema, distal pulses 2+. Skin: Warm and dry. Musculoskeletal: No kyphosis. Neuropsychiatric: Alert and oriented x3, affect grossly appropriate.  ECG:  An ECG dated 01/06/2020 was personally reviewed today and demonstrated:  Atrial flutter with variable AV block rate of 71.  RSR prime or QR pattern in V1 suggests right ventricular conduction delay, nonspecific ST and T wave abnormality.  Recent Labwork: 12/22/2019: TSH 0.435 12/24/2019: ALT 36; AST 31; BUN 59; Creatinine, Ser 3.19; Hemoglobin 13.1; Magnesium 2.5; Platelets 213; Potassium 4.5; Sodium 139     Component Value Date/Time   TRIG 200 (H) 12/20/2019 3716    Other Studies Reviewed Today:  Echocardiogram 12/23/2019 1. Left ventricular ejection fraction, by estimation, is 55 to 60%. The left ventricle has normal function. The left ventricle has no regional wall motion abnormalities. There is moderate left ventricular hypertrophy. Left ventricular diastolic parameters are indeterminate. 2. Right ventricular systolic function is normal. The right ventricular size is normal. 3. Left atrial size was mildly dilated. 4. The mitral valve is normal in structure. No evidence of mitral valve regurgitation. No evidence of mitral stenosis. 5. The aortic valve is tricuspid. Aortic valve regurgitation is not visualized. No aortic stenosis is present. 6. The inferior vena cava is normal in size with greater than 50% respiratory variability, suggesting right atrial pressure of 3 mmHg.  Assessment and Plan:  1. Atrial fibrillation with RVR ([place])   2. Benign essential HTN   3. CKD (chronic kidney disease) stage 4, GFR 15-29 ml/min (HCC)   4. Pneumonia due to COVID-19 virus    1. Atrial fibrillation with RVR ([place]) Recent admission for new onset atrial fibrillation with  RVR.  Was started on a Cardizem drip along with metoprolol.  He was discharged on Eliquis 5 mg p.o. twice daily, diltiazem 180 mg p.o. daily along with metoprolol 100 mg p.o. twice daily.  He presents today post hospital visit.  He denies any palpitations.  However EKG shows atrial flutter with variable AV block rate of 71, RSR prime or QR pattern in V1 suggesting right ventricular conduction delay, nonspecific ST and T wave abnormality.  He states he stopped his Eliquis on January 03, 2020 due to bruising and bleeding hemorrhoids.  I advised the patient he needs to restart his Coumadin at the previous dosage and frequency.  Advised him not to miss any doses.  We need to follow-up in 3 weeks to reassess and schedule for possible DC cardioversion.  2. Benign essential HTN Blood pressures well controlled today with a blood pressure of 112/74.  Continue metoprolol 100 mg p.o. twice daily.  Diltiazem 180 mg daily.  3. CKD (chronic kidney disease) stage 4, GFR 15-29 ml/min (HCC) History of stage IV CKD.  He sees nephrology.  Recent lab work on 12/24/2019 showed creatinine of 3.19 with a GFR of 22.  4. Pneumonia due to COVID-19 virus Recent hospital admission for Covid pneumonia.  Patient finished his course of remdesivir and prednisone outpatient.  He has no symptoms.  Medication Adjustments/Labs and Tests Ordered: Current medicines are reviewed at length with the patient today.  Concerns regarding medicines are outlined above.   Disposition: Follow-up with Dr. Harl Bowie or APP 3 weeks  Signed, Levell July, NP 01/06/2020 11:58 AM    [facility] [place], [place], [place] [address] Phone: [phone]; Fax: [phone]

## 2020-01-06 ENCOUNTER — Ambulatory Visit (INDEPENDENT_AMBULATORY_CARE_PROVIDER_SITE_OTHER): Payer: 59 | Admitting: Family Medicine

## 2020-01-06 ENCOUNTER — Other Ambulatory Visit: Payer: Self-pay

## 2020-01-06 ENCOUNTER — Encounter: Payer: Self-pay | Admitting: Family Medicine

## 2020-01-06 VITALS — BP 112/74 | HR 72 | Ht 72.0 in | Wt 333.0 lb

## 2020-01-06 DIAGNOSIS — I4891 Unspecified atrial fibrillation: Secondary | ICD-10-CM

## 2020-01-06 DIAGNOSIS — U071 COVID-19: Secondary | ICD-10-CM

## 2020-01-06 DIAGNOSIS — I10 Essential (primary) hypertension: Secondary | ICD-10-CM

## 2020-01-06 DIAGNOSIS — N184 Chronic kidney disease, stage 4 (severe): Secondary | ICD-10-CM | POA: Diagnosis not present

## 2020-01-06 DIAGNOSIS — J1282 Pneumonia due to coronavirus disease 2019: Secondary | ICD-10-CM

## 2020-01-06 MED ORDER — APIXABAN 5 MG PO TABS: 5.0000 mg | ORAL_TABLET | Freq: Two times a day (BID) | ORAL | 0 refills | Status: DC

## 2020-01-06 MED ORDER — APIXABAN 5 MG PO TABS: 5.0000 mg | ORAL_TABLET | Freq: Two times a day (BID) | ORAL | 6 refills | Status: DC

## 2020-01-06 NOTE — Addendum Note (Signed)
Addended by: Julian Hy T on: 01/06/2020 03:00 PM   Modules accepted: Orders

## 2020-01-06 NOTE — Patient Instructions (Addendum)
Medication Instructions:   Resume your Eliquis at 5mg  twice a day.  Continue all other medications.    Labwork: none  Testing/Procedures: none  Follow-Up: 3 weeks   Any Other Special Instructions Will Be Listed Below (If Applicable).  If you need a refill on your cardiac medications before your next appointment, please call your pharmacy.

## 2020-01-07 ENCOUNTER — Telehealth: Payer: Self-pay | Admitting: Family Medicine

## 2020-01-07 DIAGNOSIS — I10 Essential (primary) hypertension: Secondary | ICD-10-CM

## 2020-01-07 DIAGNOSIS — N184 Chronic kidney disease, stage 4 (severe): Secondary | ICD-10-CM

## 2020-01-07 DIAGNOSIS — I4891 Unspecified atrial fibrillation: Secondary | ICD-10-CM

## 2020-01-07 NOTE — Telephone Encounter (Signed)
Patient is returning a call to [employer].

## 2020-01-07 NOTE — Telephone Encounter (Signed)
Patient informed that he needed to have labs just prior to next office visit.  Will need CBC, BMET.  Will mail orders to home since next ov is not until 01/27/2020.  He prefers to go to [facility].

## 2020-01-24 ENCOUNTER — Telehealth: Payer: Self-pay | Admitting: Internal Medicine

## 2020-01-24 NOTE — Telephone Encounter (Signed)
Called patient to remind of appt with Christopher Boyer on Mon 01/27/20 at 8am.  He confirmed that he will be there.

## 2020-01-26 NOTE — H&P (View-Only) (Signed)
Cardiology Office Note  Date: 01/27/2020   ID: DYSHAWN CANGELOSI, DOB 12-19-1970, MRN [id number]  PCP:  [facility]  Cardiologist:  No primary care provider on file. Electrophysiologist:  None   Chief Complaint: Hospital follow up   History of Present Illness: Christopher Boyer is a 49 y.o. male with a history of morbid obesity, GAD, CKD Stage IV, HTN, HLD, Vitamin D deficiency. Recent dx of COVID pneumonia hospitalized 12/20/2019.   Admitted 12/22/2019: Woke up in am with chest pain. He had been discharged the prior day due to Covid pneumonia  infection and returned with atrial fibrillation with RVR and started on Cardizem gtt. Echocardiogram EF 55-60%. He was seen by cardiology and was to have possible cardioversion in the outpatient setting.  He was continued on Cardizem 180 mg CD daily, he was started on Eliquis 5 mg p.o. twice daily.  He was discharged on metoprolol 100 mg p.o. twice daily.   He presented last visit for hospital follow-up stating he is feeling much better.  He denied any other Covid symptoms such as shortness of breath/dyspnea, coughing, fever, chills.  Stated he finished his remdesivir and prednisone.  He denied any current palpitations or arrhythmias, orthostatic symptoms, stroke or TIA-like symptoms, PND, orthopnea, claudication-like symptoms, DVT or PE-like symptoms, lower extremity edema.  He states he stopped his Eliquis due to bruising and bleeding from hemorrhoids.  He has chronic kidney disease stage IV with a creatinine of 3.19.  He follows with nephrology.  He presents today for follow-up.  He states he has been religiously taking his Eliquis without interruption.  He continues with an irregularly irregular heart rate.  Heart rate today 112 on arrival.  He denies any anginal or exertional symptoms, PND, orthopnea, dizziness, CVA or TIA-like symptoms, bleeding, claudication-like symptoms, DVT or PE-like symptoms, or lower  extremity edema.  Blood pressure is slightly elevated at 138/90.  He states usually his blood pressure at home is better.  Discussed the process of DCCV and risk and benefits.  Patient is willing to undergo DC cardioversion.  He states the doctors told him that the Covid infection may have been the impetus for the onset of atrial fibrillation.   Past Medical History:  Diagnosis Date  . Chronic kidney disease (CKD), stage III (moderate) (HCC)   . Gout due to renal impairment   . Hypertension   . Leg edema   . Obesity     No past surgical history on file.  Current Outpatient Medications  Medication Sig Dispense Refill  . ALPRAZolam (XANAX) 0.5 MG tablet Take 1 mg by mouth at bedtime.     Marland Kitchen apixaban (ELIQUIS) 5 MG TABS tablet Take 1 tablet (5 mg total) by mouth 2 (two) times daily. 60 tablet 6  . ascorbic acid (VITAMIN C) 500 MG tablet Take 500 mg by mouth daily.     Marland Kitchen buPROPion (WELLBUTRIN XL) 300 MG 24 hr tablet Take 300 mg by mouth daily.     . Cholecalciferol 25 MCG (1000 UT) tablet Take 1,000 Units by mouth daily.     Marland Kitchen diltiazem (CARDIZEM CD) 180 MG 24 hr capsule Take 1 capsule (180 mg total) by mouth daily. 30 capsule 3  . escitalopram (LEXAPRO) 10 MG tablet Take 10 mg by mouth daily.    Marland Kitchen escitalopram (LEXAPRO) 20 MG tablet Take 20 mg by mouth See admin instructions.     . fenofibrate 160 MG tablet Take 160 mg by mouth  daily.    . loperamide (IMODIUM) 2 MG capsule Take 1 capsule (2 mg total) by mouth as needed for diarrhea or loose stools. 30 capsule 0  . metoprolol tartrate (LOPRESSOR) 100 MG tablet Take 100 mg by mouth 2 (two) times daily.    Marland Kitchen omeprazole (PRILOSEC) 20 MG capsule Take 20 mg by mouth daily.    Marland Kitchen triamcinolone ointment (KENALOG) 0.5 % Apply 1 application topically daily as needed.      No current facility-administered medications for this visit.   Allergies:  Septra [sulfamethoxazole-trimethoprim]   Social History: The patient  reports that he has never smoked.  He has never used smokeless tobacco. He reports that he does not drink alcohol and does not use drugs.   Family History: The patient's family history includes Atrial fibrillation in his father and mother; Valvular heart disease in his mother.   ROS:  Please see the history of present illness. Otherwise, complete review of systems is positive for none.  All other systems are reviewed and negative.   Physical Exam: VS:  BP 138/90   Pulse (!) 112   Ht 6\' 2"  (1.88 m)   Wt (!) 344 lb (156 kg)   SpO2 98%   BMI 44.17 kg/m , BMI Body mass index is 44.17 kg/m.  Wt Readings from Last 3 Encounters:  01/27/20 (!) 344 lb (156 kg)  01/06/20 (!) 333 lb (151 kg)  12/24/19 (!) 328 lb 14.8 oz (149.2 kg)    General: Morbidly obese patient appears comfortable at rest. Neck: Supple, no elevated JVP or carotid bruits, no thyromegaly. Lungs: Clear to auscultation, nonlabored breathing at rest. Cardiac: Irregularly irregular and rhythm, no S3 or significant systolic murmur, no pericardial rub. Extremities: No pitting edema, distal pulses 2+. Skin: Warm and dry. Musculoskeletal: No kyphosis. Neuropsychiatric: Alert and oriented x3, affect grossly appropriate.  ECG:  An ECG dated 01/27/2020 was personally reviewed today and demonstrated:  Atrial flutter with variable AV block incomplete right bundle branch block, nonspecific ST and T wave abnormality rate of 76.  Recent Labwork: 12/22/2019: TSH 0.435 12/24/2019: ALT 36; AST 31; BUN 59; Creatinine, Ser 3.19; Hemoglobin 13.1; Magnesium 2.5; Platelets 213; Potassium 4.5; Sodium 139   Recent renal function on lab work dated 01/23/2020 showed creatinine of 3.70, estimated GFR 18.  CBC showed hemoglobin of 12 and hematocrit of 37.      Component Value Date/Time   TRIG 200 (H) 12/20/2019 5809    Other Studies Reviewed Today:  Echocardiogram 12/23/2019 1. Left ventricular ejection fraction, by estimation, is 55 to 60%. The left ventricle has normal function.  The left ventricle has no regional wall motion abnormalities. There is moderate left ventricular hypertrophy. Left ventricular diastolic parameters are indeterminate. 2. Right ventricular systolic function is normal. The right ventricular size is normal. 3. Left atrial size was mildly dilated. 4. The mitral valve is normal in structure. No evidence of mitral valve regurgitation. No evidence of mitral stenosis. 5. The aortic valve is tricuspid. Aortic valve regurgitation is not visualized. No aortic stenosis is present. 6. The inferior vena cava is normal in size with greater than 50% respiratory variability, suggesting right atrial pressure of 3 mmHg.  Assessment and Plan:   1. Atrial fibrillation with RVR ([place]) Recent admission for new onset atrial fibrillation with RVR.  Was started on a Cardizem drip along with metoprolol.  He was discharged on Eliquis 5 mg p.o. twice daily, diltiazem 180 mg p.o. daily along with metoprolol 100 mg p.o. twice  daily.  He presents today post hospital visit.  He denies any palpitations.    He stated he stopped his Eliquis on January 03, 2020 due to bruising and bleeding hemorrhoids.  I advised the patient he needed to restart his Coumadin at the previous dosage and frequency.  Advised him not to miss any doses.  Today he states he has been taking the Eliquis religiously as of last visit on 01/06/2020.  We will schedule DC cardioversion.  Risk and benefits have been discussed with the patient.  He verbalizes understanding.  Recheck of EKG today shows atrial flutter with variable AV block.  Incomplete right bundle blanch block rate of 76.  2. Benign essential HTN Blood pressure slightly elevated today at 138/90.  He states he only took his antihypertensive medications about an hour before coming to clinic.Marland Kitchen  Continue metoprolol 100 mg p.o. twice daily.  Diltiazem 180 mg daily.  3. CKD (chronic kidney disease) stage 4, GFR 15-29 ml/min (HCC) History of stage IV  CKD.  He sees nephrology.  Recent lab work on 12/24/2019 showed creatinine of 3.19 with a GFR of 22. Lab work dated 01/23/2020 showed creatinine of 3.70, estimated GFR 18.   4. Pneumonia due to COVID-19 virus Recent hospital admission for Covid pneumonia.  Patient finished his course of remdesivir and prednisone outpatient.  He has no symptoms.   Medication Adjustments/Labs and Tests Ordered: Current medicines are reviewed at length with the patient today.  Concerns regarding medicines are outlined above.   Disposition: Follow-up with Dr. Harl Bowie or APP in 4 weeks after DC cardioversion  Signed, Levell July, NP 01/27/2020 7:54 AM    [facility] [place], [place], [place] [address] Phone: [phone]; Fax: [phone]

## 2020-01-26 NOTE — Progress Notes (Signed)
Cardiology Office Note  Date: 01/27/2020   ID: Christopher Boyer, DOB 1971/03/11, MRN [id number]  PCP:  [facility]  Cardiologist:  No primary care provider on file. Electrophysiologist:  None   Chief Complaint: Hospital follow up   History of Present Illness: Christopher Boyer is a 49 y.o. male with a history of morbid obesity, GAD, CKD Stage IV, HTN, HLD, Vitamin D deficiency. Recent dx of COVID pneumonia hospitalized 12/20/2019.   Admitted 12/22/2019: Woke up in am with chest pain. He had been discharged the prior day due to Covid pneumonia  infection and returned with atrial fibrillation with RVR and started on Cardizem gtt. Echocardiogram EF 55-60%. He was seen by cardiology and was to have possible cardioversion in the outpatient setting.  He was continued on Cardizem 180 mg CD daily, he was started on Eliquis 5 mg p.o. twice daily.  He was discharged on metoprolol 100 mg p.o. twice daily.   He presented last visit for hospital follow-up stating he is feeling much better.  He denied any other Covid symptoms such as shortness of breath/dyspnea, coughing, fever, chills.  Stated he finished his remdesivir and prednisone.  He denied any current palpitations or arrhythmias, orthostatic symptoms, stroke or TIA-like symptoms, PND, orthopnea, claudication-like symptoms, DVT or PE-like symptoms, lower extremity edema.  He states he stopped his Eliquis due to bruising and bleeding from hemorrhoids.  He has chronic kidney disease stage IV with a creatinine of 3.19.  He follows with nephrology.  He presents today for follow-up.  He states he has been religiously taking his Eliquis without interruption.  He continues with an irregularly irregular heart rate.  Heart rate today 112 on arrival.  He denies any anginal or exertional symptoms, PND, orthopnea, dizziness, CVA or TIA-like symptoms, bleeding, claudication-like symptoms, DVT or PE-like symptoms, or lower  extremity edema.  Blood pressure is slightly elevated at 138/90.  He states usually his blood pressure at home is better.  Discussed the process of DCCV and risk and benefits.  Patient is willing to undergo DC cardioversion.  He states the doctors told him that the Covid infection may have been the impetus for the onset of atrial fibrillation.   Past Medical History:  Diagnosis Date  . Chronic kidney disease (CKD), stage III (moderate) (HCC)   . Gout due to renal impairment   . Hypertension   . Leg edema   . Obesity     No past surgical history on file.  Current Outpatient Medications  Medication Sig Dispense Refill  . ALPRAZolam (XANAX) 0.5 MG tablet Take 1 mg by mouth at bedtime.     Marland Kitchen apixaban (ELIQUIS) 5 MG TABS tablet Take 1 tablet (5 mg total) by mouth 2 (two) times daily. 60 tablet 6  . ascorbic acid (VITAMIN C) 500 MG tablet Take 500 mg by mouth daily.     Marland Kitchen buPROPion (WELLBUTRIN XL) 300 MG 24 hr tablet Take 300 mg by mouth daily.     . Cholecalciferol 25 MCG (1000 UT) tablet Take 1,000 Units by mouth daily.     Marland Kitchen diltiazem (CARDIZEM CD) 180 MG 24 hr capsule Take 1 capsule (180 mg total) by mouth daily. 30 capsule 3  . escitalopram (LEXAPRO) 10 MG tablet Take 10 mg by mouth daily.    Marland Kitchen escitalopram (LEXAPRO) 20 MG tablet Take 20 mg by mouth See admin instructions.     . fenofibrate 160 MG tablet Take 160 mg by mouth  daily.    . loperamide (IMODIUM) 2 MG capsule Take 1 capsule (2 mg total) by mouth as needed for diarrhea or loose stools. 30 capsule 0  . metoprolol tartrate (LOPRESSOR) 100 MG tablet Take 100 mg by mouth 2 (two) times daily.    Marland Kitchen omeprazole (PRILOSEC) 20 MG capsule Take 20 mg by mouth daily.    Marland Kitchen triamcinolone ointment (KENALOG) 0.5 % Apply 1 application topically daily as needed.      No current facility-administered medications for this visit.   Allergies:  Septra [sulfamethoxazole-trimethoprim]   Social History: The patient  reports that he has never smoked.  He has never used smokeless tobacco. He reports that he does not drink alcohol and does not use drugs.   Family History: The patient's family history includes Atrial fibrillation in his father and mother; Valvular heart disease in his mother.   ROS:  Please see the history of present illness. Otherwise, complete review of systems is positive for none.  All other systems are reviewed and negative.   Physical Exam: VS:  BP 138/90   Pulse (!) 112   Ht 6\' 2"  (1.88 m)   Wt (!) 344 lb (156 kg)   SpO2 98%   BMI 44.17 kg/m , BMI Body mass index is 44.17 kg/m.  Wt Readings from Last 3 Encounters:  01/27/20 (!) 344 lb (156 kg)  01/06/20 (!) 333 lb (151 kg)  12/24/19 (!) 328 lb 14.8 oz (149.2 kg)    General: Morbidly obese patient appears comfortable at rest. Neck: Supple, no elevated JVP or carotid bruits, no thyromegaly. Lungs: Clear to auscultation, nonlabored breathing at rest. Cardiac: Irregularly irregular and rhythm, no S3 or significant systolic murmur, no pericardial rub. Extremities: No pitting edema, distal pulses 2+. Skin: Warm and dry. Musculoskeletal: No kyphosis. Neuropsychiatric: Alert and oriented x3, affect grossly appropriate.  ECG:  An ECG dated 01/27/2020 was personally reviewed today and demonstrated:  Atrial flutter with variable AV block incomplete right bundle branch block, nonspecific ST and T wave abnormality rate of 76.  Recent Labwork: 12/22/2019: TSH 0.435 12/24/2019: ALT 36; AST 31; BUN 59; Creatinine, Ser 3.19; Hemoglobin 13.1; Magnesium 2.5; Platelets 213; Potassium 4.5; Sodium 139   Recent renal function on lab work dated 01/23/2020 showed creatinine of 3.70, estimated GFR 18.  CBC showed hemoglobin of 12 and hematocrit of 37.      Component Value Date/Time   TRIG 200 (H) 12/20/2019 5465    Other Studies Reviewed Today:  Echocardiogram 12/23/2019 1. Left ventricular ejection fraction, by estimation, is 55 to 60%. The left ventricle has normal function.  The left ventricle has no regional wall motion abnormalities. There is moderate left ventricular hypertrophy. Left ventricular diastolic parameters are indeterminate. 2. Right ventricular systolic function is normal. The right ventricular size is normal. 3. Left atrial size was mildly dilated. 4. The mitral valve is normal in structure. No evidence of mitral valve regurgitation. No evidence of mitral stenosis. 5. The aortic valve is tricuspid. Aortic valve regurgitation is not visualized. No aortic stenosis is present. 6. The inferior vena cava is normal in size with greater than 50% respiratory variability, suggesting right atrial pressure of 3 mmHg.  Assessment and Plan:   1. Atrial fibrillation with RVR ([place]) Recent admission for new onset atrial fibrillation with RVR.  Was started on a Cardizem drip along with metoprolol.  He was discharged on Eliquis 5 mg p.o. twice daily, diltiazem 180 mg p.o. daily along with metoprolol 100 mg p.o. twice  daily.  He presents today post hospital visit.  He denies any palpitations.    He stated he stopped his Eliquis on January 03, 2020 due to bruising and bleeding hemorrhoids.  I advised the patient he needed to restart his Coumadin at the previous dosage and frequency.  Advised him not to miss any doses.  Today he states he has been taking the Eliquis religiously as of last visit on 01/06/2020.  We will schedule DC cardioversion.  Risk and benefits have been discussed with the patient.  He verbalizes understanding.  Recheck of EKG today shows atrial flutter with variable AV block.  Incomplete right bundle blanch block rate of 76.  2. Benign essential HTN Blood pressure slightly elevated today at 138/90.  He states he only took his antihypertensive medications about an hour before coming to clinic.Marland Kitchen  Continue metoprolol 100 mg p.o. twice daily.  Diltiazem 180 mg daily.  3. CKD (chronic kidney disease) stage 4, GFR 15-29 ml/min (HCC) History of stage IV  CKD.  He sees nephrology.  Recent lab work on 12/24/2019 showed creatinine of 3.19 with a GFR of 22. Lab work dated 01/23/2020 showed creatinine of 3.70, estimated GFR 18.   4. Pneumonia due to COVID-19 virus Recent hospital admission for Covid pneumonia.  Patient finished his course of remdesivir and prednisone outpatient.  He has no symptoms.   Medication Adjustments/Labs and Tests Ordered: Current medicines are reviewed at length with the patient today.  Concerns regarding medicines are outlined above.   Disposition: Follow-up with Dr. Harl Bowie or APP in 4 weeks after DC cardioversion  Signed, Levell July, NP 01/27/2020 7:54 AM    [facility], [place], [place] [address] Phone: [phone]; Fax: [phone]

## 2020-01-27 ENCOUNTER — Ambulatory Visit (INDEPENDENT_AMBULATORY_CARE_PROVIDER_SITE_OTHER): Payer: 59 | Admitting: Family Medicine

## 2020-01-27 ENCOUNTER — Encounter: Payer: Self-pay | Admitting: Family Medicine

## 2020-01-27 ENCOUNTER — Encounter: Payer: Self-pay | Admitting: *Deleted

## 2020-01-27 VITALS — BP 138/90 | HR 112 | Ht 74.0 in | Wt 344.0 lb

## 2020-01-27 DIAGNOSIS — I4891 Unspecified atrial fibrillation: Secondary | ICD-10-CM | POA: Diagnosis not present

## 2020-01-27 NOTE — Patient Instructions (Addendum)
Your physician recommends that you schedule a follow-up appointment in: [place], NP   Your physician recommends that you continue on your current medications as directed. Please refer to the Current Medication list given to you today.   Electrical Cardioversion uses a jolt of electricity to your heart either through paddles or wired patches attached to your chest. This is a controlled, usually prescheduled, procedure. This procedure is done at the hospital and you are not awake during the procedure. You usually go home the day of the procedure. Please see the instruction sheet given to you today for more information.  Thank you for choosing [facility]!!

## 2020-01-31 NOTE — Patient Instructions (Signed)
CHINONSO LINKER  01/31/2020     @PREFPERIOPPHARMACY @   Your procedure is scheduled on  02/06/2020.  Report to [facility] at   0800 A.M.  Call this number if you have problems the morning of surgery:  [phone]   Remember:  Do not eat or drink after midnight.                        Take these medicines the morning of surgery with A SIP OF WATER wellbutrin, zyrtec, lexapro, diltiazem, prilosec.    Do not wear jewelry, make-up or nail polish.  Do not wear lotions, powders, or perfumes. Please wear deodorant and brush your teeth  Do not shave 48 hours prior to surgery.  Men may shave face and neck.  Do not bring valuables to the hospital.  [facility] is not responsible for any belongings or valuables.  Contacts, dentures or bridgework may not be worn into surgery.  Leave your suitcase in the car.  After surgery it may be brought to your room.  For patients admitted to the hospital, discharge time will be determined by your treatment team.  Patients discharged the day of surgery will not be allowed to drive home.   Name and phone number of your driver:   family Special instructions:  DO NOT smoke the morning of your procedure.  Please read over the following fact sheets that you were given. Anesthesia Post-op Instructions and Care and Recovery After Surgery       Electrical Cardioversion Electrical cardioversion is the delivery of a jolt of electricity to restore a normal rhythm to the heart. A rhythm that is too fast or is not regular keeps the heart from pumping well. In this procedure, sticky patches or metal paddles are placed on the chest to deliver electricity to the heart from a device. This procedure may be done in an emergency if:  There is low or no blood pressure as a result of the heart rhythm.  Normal rhythm must be restored as fast as possible to protect the brain and heart from further damage.  It may save a life. This may also be a scheduled  procedure for irregular or fast heart rhythms that are not immediately life-threatening. Tell a health care provider about:  Any allergies you have.  All medicines you are taking, including vitamins, herbs, eye drops, creams, and over-the-counter medicines.  Any problems you or family members have had with anesthetic medicines.  Any blood disorders you have.  Any surgeries you have had.  Any medical conditions you have.  Whether you are pregnant or may be pregnant. What are the risks? Generally, this is a safe procedure. However, problems may occur, including:  Allergic reactions to medicines.  A blood clot that breaks free and travels to other parts of your body.  The possible return of an abnormal heart rhythm within hours or days after the procedure.  Your heart stopping (cardiac arrest). This is rare. What happens before the procedure? Medicines  Your health care provider may have you start taking: ? Blood-thinning medicines (anticoagulants) so your blood does not clot as easily. ? Medicines to help stabilize your heart rate and rhythm.  Ask your health care provider about: ? Changing or stopping your regular medicines. This is especially important if you are taking diabetes medicines or blood thinners. ? Taking medicines such as aspirin and ibuprofen. These medicines can thin your blood. Do  not take these medicines unless your health care provider tells you to take them. ? Taking over-the-counter medicines, vitamins, herbs, and supplements. General instructions  Follow instructions from your health care provider about eating or drinking restrictions.  Plan to have someone take you home from the hospital or clinic.  If you will be going home right after the procedure, plan to have someone with you for 24 hours.  Ask your health care provider what steps will be taken to help prevent infection. These may include washing your skin with a germ-killing soap. What happens  during the procedure?   An IV will be inserted into one of your veins.  Sticky patches (electrodes) or metal paddles may be placed on your chest.  You will be given a medicine to help you relax (sedative).  An electrical shock will be delivered. The procedure may vary among health care providers and hospitals. What can I expect after the procedure?  Your blood pressure, heart rate, breathing rate, and blood oxygen level will be monitored until you leave the hospital or clinic.  Your heart rhythm will be watched to make sure it does not change.  You may have some redness on the skin where the shocks were given. Follow these instructions at home:  Do not drive for 24 hours if you were given a sedative during your procedure.  Take over-the-counter and prescription medicines only as told by your health care provider.  Ask your health care provider how to check your pulse. Check it often.  Rest for 48 hours after the procedure or as told by your health care provider.  Avoid or limit your caffeine use as told by your health care provider.  Keep all follow-up visits as told by your health care provider. This is important. Contact a health care provider if:  You feel like your heart is beating too quickly or your pulse is not regular.  You have a serious muscle cramp that does not go away. Get help right away if:  You have discomfort in your chest.  You are dizzy or you feel faint.  You have trouble breathing or you are short of breath.  Your speech is slurred.  You have trouble moving an arm or leg on one side of your body.  Your fingers or toes turn cold or blue. Summary  Electrical cardioversion is the delivery of a jolt of electricity to restore a normal rhythm to the heart.  This procedure may be done right away in an emergency or may be a scheduled procedure if the condition is not an emergency.  Generally, this is a safe procedure.  After the procedure, check  your pulse often as told by your health care provider. This information is not intended to replace advice given to you by your health care provider. Make sure you discuss any questions you have with your health care provider. Document Revised: 11/12/2018 Document Reviewed: 11/12/2018 Elsevier Patient Education  [place] After These instructions provide you with information about caring for yourself after your procedure. Your health care provider may also give you more specific instructions. Your treatment has been planned according to current medical practices, but problems sometimes occur. Call your health care provider if you have any problems or questions after your procedure. What can I expect after the procedure? After your procedure, you may:  Feel sleepy for several hours.  Feel clumsy and have poor balance for several hours.  Feel forgetful about what  happened after the procedure.  Have poor judgment for several hours.  Feel nauseous or vomit.  Have a sore throat if you had a breathing tube during the procedure. Follow these instructions at home: For at least 24 hours after the procedure:      Have a responsible adult stay with you. It is important to have someone help care for you until you are awake and alert.  Rest as needed.  Do not: ? Participate in activities in which you could fall or become injured. ? Drive. ? Use heavy machinery. ? Drink alcohol. ? Take sleeping pills or medicines that cause drowsiness. ? Make important decisions or sign legal documents. ? Take care of children on your own. Eating and drinking  Follow the diet that is recommended by your health care provider.  If you vomit, drink water, juice, or soup when you can drink without vomiting.  Make sure you have little or no nausea before eating solid foods. General instructions  Take over-the-counter and prescription medicines only as told by your  health care provider.  If you have sleep apnea, surgery and certain medicines can increase your risk for breathing problems. Follow instructions from your health care provider about wearing your sleep device: ? Anytime you are sleeping, including during daytime naps. ? While taking prescription pain medicines, sleeping medicines, or medicines that make you drowsy.  If you smoke, do not smoke without supervision.  Keep all follow-up visits as told by your health care provider. This is important. Contact a health care provider if:  You keep feeling nauseous or you keep vomiting.  You feel light-headed.  You develop a rash.  You have a fever. Get help right away if:  You have trouble breathing. Summary  For several hours after your procedure, you may feel sleepy and have poor judgment.  Have a responsible adult stay with you for at least 24 hours or until you are awake and alert. This information is not intended to replace advice given to you by your health care provider. Make sure you discuss any questions you have with your health care provider. Document Revised: 07/10/2017 Document Reviewed: 08/02/2015 Elsevier Patient Education  [place].

## 2020-02-04 ENCOUNTER — Encounter (HOSPITAL_COMMUNITY): Payer: Self-pay

## 2020-02-04 ENCOUNTER — Encounter (HOSPITAL_COMMUNITY)
Admission: RE | Admit: 2020-02-04 | Discharge: 2020-02-04 | Disposition: A | Discharge status: Home / Self Care | Payer: 59 | Source: Ambulatory Visit | Attending: Cardiology | Admitting: Cardiology

## 2020-02-04 ENCOUNTER — Other Ambulatory Visit: Payer: Self-pay

## 2020-02-04 ENCOUNTER — Other Ambulatory Visit (HOSPITAL_COMMUNITY)
Admission: RE | Admit: 2020-02-04 | Discharge: 2020-02-04 | Disposition: A | Discharge status: Home / Self Care | Payer: 59 | Source: Ambulatory Visit | Attending: Cardiology | Admitting: Cardiology

## 2020-02-04 DIAGNOSIS — Z20822 Contact with and (suspected) exposure to covid-19: Secondary | ICD-10-CM | POA: Insufficient documentation

## 2020-02-04 DIAGNOSIS — I4891 Unspecified atrial fibrillation: Secondary | ICD-10-CM | POA: Diagnosis not present

## 2020-02-04 LAB — BASIC METABOLIC PANEL
Anion gap: 7 (ref 5–15)
BUN: 50 mg/dL — ABNORMAL HIGH (ref 6–20)
CO2: 28 mmol/L (ref 22–32)
Calcium: 9.3 mg/dL (ref 8.9–10.3)
Chloride: 106 mmol/L (ref 98–111)
Creatinine, Ser: 3.68 mg/dL — ABNORMAL HIGH (ref 0.61–1.24)
GFR, Estimated: 18 mL/min — ABNORMAL LOW (ref >60)
Glucose, Bld: 93 mg/dL (ref 70–99)
Potassium: 4.7 mmol/L (ref 3.5–5.1)
Sodium: 141 mmol/L (ref 135–145)

## 2020-02-04 LAB — PROTIME-INR
INR: 1.1 (ref 0.8–1.2)
Prothrombin Time: 13.8 seconds (ref 11.4–15.2)

## 2020-02-05 LAB — SARS CORONAVIRUS 2 (TAT 6-24 HRS): SARS Coronavirus 2: NEGATIVE

## 2020-02-06 ENCOUNTER — Encounter (HOSPITAL_COMMUNITY): Payer: Self-pay | Admitting: Cardiology

## 2020-02-06 ENCOUNTER — Ambulatory Visit (HOSPITAL_COMMUNITY): Payer: 59 | Admitting: Anesthesiology

## 2020-02-06 ENCOUNTER — Ambulatory Visit (HOSPITAL_COMMUNITY)
Admission: RE | Admit: 2020-02-06 | Discharge: 2020-02-06 | Disposition: A | Discharge status: Home / Self Care | Payer: 59 | Attending: Cardiology | Admitting: Cardiology

## 2020-02-06 ENCOUNTER — Encounter (HOSPITAL_COMMUNITY)
Admission: RE | Disposition: A | Discharge status: Home / Self Care | Payer: Self-pay | Source: Home / Self Care | Attending: Cardiology

## 2020-02-06 DIAGNOSIS — Z7901 Long term (current) use of anticoagulants: Secondary | ICD-10-CM | POA: Insufficient documentation

## 2020-02-06 DIAGNOSIS — I4892 Unspecified atrial flutter: Secondary | ICD-10-CM | POA: Diagnosis present

## 2020-02-06 DIAGNOSIS — Z6841 Body Mass Index (BMI) 40.0 and over, adult: Secondary | ICD-10-CM | POA: Insufficient documentation

## 2020-02-06 DIAGNOSIS — Z79899 Other long term (current) drug therapy: Secondary | ICD-10-CM | POA: Insufficient documentation

## 2020-02-06 DIAGNOSIS — M109 Gout, unspecified: Secondary | ICD-10-CM | POA: Insufficient documentation

## 2020-02-06 DIAGNOSIS — Z8616 Personal history of COVID-19: Secondary | ICD-10-CM | POA: Insufficient documentation

## 2020-02-06 DIAGNOSIS — I4891 Unspecified atrial fibrillation: Secondary | ICD-10-CM | POA: Insufficient documentation

## 2020-02-06 DIAGNOSIS — E785 Hyperlipidemia, unspecified: Secondary | ICD-10-CM | POA: Diagnosis not present

## 2020-02-06 DIAGNOSIS — N184 Chronic kidney disease, stage 4 (severe): Secondary | ICD-10-CM | POA: Diagnosis not present

## 2020-02-06 DIAGNOSIS — I129 Hypertensive chronic kidney disease with stage 1 through stage 4 chronic kidney disease, or unspecified chronic kidney disease: Secondary | ICD-10-CM | POA: Diagnosis not present

## 2020-02-06 DIAGNOSIS — I483 Typical atrial flutter: Secondary | ICD-10-CM

## 2020-02-06 DIAGNOSIS — Z882 Allergy status to sulfonamides status: Secondary | ICD-10-CM | POA: Diagnosis not present

## 2020-02-06 HISTORY — PX: CARDIOVERSION: SHX1299

## 2020-02-06 SURGERY — CARDIOVERSION
Anesthesia: General

## 2020-02-06 MED ORDER — LIDOCAINE HCL (CARDIAC) PF 100 MG/5ML IV SOSY
PREFILLED_SYRINGE | INTRAVENOUS | Status: DC | PRN
  Administered 2020-02-06: 40 mg via INTRAVENOUS

## 2020-02-06 MED ORDER — LIDOCAINE 2% (20 MG/ML) 5 ML SYRINGE
INTRAMUSCULAR | Status: AC
  Filled 2020-02-06: qty 5

## 2020-02-06 MED ORDER — LACTATED RINGERS IV SOLN
Freq: Once | INTRAVENOUS | Status: AC
  Administered 2020-02-06: 1000 mL via INTRAVENOUS

## 2020-02-06 MED ORDER — PROPOFOL 10 MG/ML IV BOLUS
INTRAVENOUS | Status: DC | PRN
  Administered 2020-02-06: 120 mg via INTRAVENOUS

## 2020-02-06 MED ORDER — PROPOFOL 10 MG/ML IV BOLUS
INTRAVENOUS | Status: AC
  Filled 2020-02-06: qty 120

## 2020-02-06 NOTE — Anesthesia Preprocedure Evaluation (Signed)
Anesthesia Evaluation  Patient identified by MRN, date of birth, ID band Patient awake    Reviewed: Allergy & Precautions, NPO status , Patient's Chart, lab work & pertinent test results, reviewed documented beta blocker date and time   History of Anesthesia Complications Negative for: history of anesthetic complications  Airway Mallampati: II  TM Distance: >3 FB Neck ROM: Full    Dental  (+) Dental Advisory Given, Teeth Intact   Pulmonary shortness of breath and with exertion, pneumonia (covid 19- 11/2019), resolved,    Pulmonary exam normal breath sounds clear to auscultation       Cardiovascular METS: 3 - Mets hypertension, Pt. on medications and Pt. on home beta blockers  Rhythm:Irregular Rate:Normal - Systolic murmurs, - Diastolic murmurs, - Friction Rub, - Carotid Bruit and - Peripheral Edema 06-Feb-2020 07:50:41  [facility] ROUTINE RECORD Atrial flutter with variable A-V block Low voltage QRS Incomplete right bundle branch block Nonspecific ST and T wave abnormality Abnormal ECG   Neuro/Psych Anxiety negative neurological ROS     GI/Hepatic negative GI ROS, Neg liver ROS,   Endo/Other  Morbid obesity  Renal/GU Renal InsufficiencyRenal disease     Musculoskeletal negative musculoskeletal ROS (+)   Abdominal   Peds  Hematology negative hematology ROS (+)   Anesthesia Other Findings   Reproductive/Obstetrics negative OB ROS                           Anesthesia Physical Anesthesia Plan  ASA: IV  Anesthesia Plan: General   Post-op Pain Management:    Induction:   PONV Risk Score and Plan: TIVA  Airway Management Planned: Nasal Cannula and Natural Airway  Additional Equipment:   Intra-op Plan:   Post-operative Plan:   Informed Consent: I have reviewed the patients History and Physical, chart, labs and discussed the procedure including the risks,  benefits and alternatives for the proposed anesthesia with the patient or authorized representative who has indicated his/her understanding and acceptance.     Dental advisory given  Plan Discussed with: CRNA and Surgeon  Anesthesia Plan Comments:        Anesthesia Quick Evaluation

## 2020-02-06 NOTE — Transfer of Care (Signed)
Immediate Anesthesia Transfer of Care Note  Patient: Christopher Boyer  Procedure(s) Performed: CARDIOVERSION (N/A )  Patient Location: PACU  Anesthesia Type:General  Level of Consciousness: awake, alert , oriented and patient cooperative  Airway & Oxygen Therapy: Patient Spontanous Breathing  Post-op Assessment: Report given to RN and Post -op Vital signs reviewed and stable  Post vital signs: Reviewed and stable  Last Vitals:  Vitals Value Taken Time  BP    Temp    Pulse    Resp    SpO2      Last Pain:  Vitals:   02/06/20 0805  TempSrc: Oral  PainSc: 2       Patients Stated Pain Goal: 5 (07/37/10 6269)  Complications: No complications documented.

## 2020-02-06 NOTE — Interval H&P Note (Signed)
History and Physical Interval Note:  02/06/2020 8:43 AM  Patient presents for elective cardioversion of persistent atrial flutter.  ECG reviewed confirming rhythm.  He reports compliance with Eliquis since last office encounter in September, also on Cardizem CD and metoprolol.  Informed consent obtained.  Lab work reviewed.  He is ready to proceed.  Satira Sark, M.D., F.A.C.C.

## 2020-02-06 NOTE — Anesthesia Postprocedure Evaluation (Signed)
Anesthesia Post Note  Patient: Christopher Boyer  Procedure(s) Performed: CARDIOVERSION (N/A )  Patient location during evaluation: PACU Anesthesia Type: General Level of consciousness: awake and alert Pain management: pain level controlled Vital Signs Assessment: post-procedure vital signs reviewed and stable Respiratory status: spontaneous breathing Cardiovascular status: stable Postop Assessment: no apparent nausea or vomiting Anesthetic complications: no   No complications documented.   Last Vitals:  Vitals:   02/06/20 0805  BP: (!) 143/109  Pulse: 83  Resp: 18  Temp: 37.2 C  SpO2: 96%    Last Pain:  Vitals:   02/06/20 0805  TempSrc: Oral  PainSc: 2                  Everette Rank

## 2020-02-06 NOTE — Progress Notes (Signed)
Electrical Cardioversion Procedure Note AZEKIEL CREMER [id number] 1970/05/21  Procedure: Electrical Cardioversion Indications:  Atrial fibrillation/atrial flutter  Procedure Details Consent: Direct current Cardioversion Time Out: Verified patient identification, verified procedure, site/side was marked, verified correct patient position, special equipment/implants available, medications/allergies/relevent history reviewed, required imaging and test results available.  Time out performed @ 0847  Patient placed on cardiac monitor, pulse oximetry, supplemental oxygen as necessary.  Sedation given: per anesthesia Pacer pads placed anterior and posterior pads   Cardioverted 1  time  Cardioverted at 0848 with 120 jules  Evaluation Findings: Post procedure EKG shows: Sinus rhythm with 1st degree AV block Complications: none Patient did tolerate procedure well. Pad sites slight pink, no irritation noted.   Sofie Rower R 02/06/2020, 9:14 AM

## 2020-02-06 NOTE — Discharge Instructions (Signed)
Atrial Flutter  Atrial flutter is a type of abnormal heart rhythm (arrhythmia). The heart has an electrical system that tells it how to beat. In atrial flutter, the signals move rapidly in the top chambers of the heart (the atria). This makes your heart beat very fast. Atrial flutter can come and go, or it can be permanent. The goal of treatment is to prevent blood clots from forming, control your heart rate, or restore your heartbeat to a normal rhythm. If this condition is not treated, it can cause serious problems, such as a weakened heart muscle (cardiomyopathy) or a stroke. What are the causes? This condition is often caused by conditions that damage the heart's electrical system. These include:  Heart conditions and heart surgery. These include heart attacks and open-heart surgery.  Lung problems, such as COPD or a blood clot in the lung (pulmonary embolism, or PE).  Poorly controlled high blood pressure (hypertension).  Overactive thyroid (hyperthyroidism).  Diabetes. In some cases, the cause of this condition is not known. What increases the risk? You are more likely to develop this condition if:  You are an elderly adult.  You are a man.  You are overweight (obese).  You have obstructive sleep apnea.  You have a family history of atrial flutter.  You have diabetes.  You drink a lot of alcohol, especially binge drinking.  You use drugs, including cannabis.  You smoke. What are the signs or symptoms? Symptoms of this condition include:  A feeling that your heart is pounding or racing (palpitations).  Shortness of breath.  Chest pain.  Feeling dizzy or light-headed.  Fainting.  Low blood pressure (hypotension).  Fatigue.  Tiring easily during exercise or activity. In some cases, there are no symptoms. How is this diagnosed? This condition may be diagnosed with:  An electrocardiogram (ECG) to check electrical signals of the heart.  An ambulatory  cardiac monitor to record your heart's activity for a few days.  An echocardiogram to create pictures of your heart.  A transesophageal echocardiogram (TEE) to create even better pictures of your heart.  A stress test to check your blood supply while you exercise.  Imaging tests, such as a CT scan or chest X-ray.  Blood tests. How is this treated? Treatment depends on underlying conditions and how you feel when you experience atrial flutter. This condition may be treated with:  Medicines to prevent blood clots or to treat heart rate or heart rhythm problems.  Electrical cardioversion to reset the heart's rhythm.  Ablation to remove the heart tissue that sends abnormal signals.  Left atrial appendage closure to seal the area where blood clots can form. In some cases, underlying conditions will be treated. Follow these instructions at home: Medicines  Take over-the-counter and prescription medicines only as told by your health care provider.  Do not take any new medicines without talking to your health care provider.  If you are taking blood thinners: ? Talk with your health care provider before you take any medicines that contain aspirin or NSAIDs, such as ibuprofen. These medicines increase your risk for dangerous bleeding. ? Take your medicine exactly as told, at the same time every day. ? Avoid activities that could cause injury or bruising, and follow instructions about how to prevent falls. ? Wear a medical alert bracelet or carry a card that lists what medicines you take. Lifestyle  Eat heart-healthy foods. Talk with a dietitian to make an eating plan that is right for you.  Do  not use any products that contain nicotine or tobacco, such as cigarettes, e-cigarettes, and chewing tobacco. If you need help quitting, ask your health care provider.  Do not drink alcohol.  Do not use drugs, including cannabis.  Lose weight if you are overweight or obese.  Exercise  regularly as instructed by your health care provider. General instructions  Do not use diet pills unless your health care provider approves. Diet pills may make heart problems worse.  If you have obstructive sleep apnea, manage your condition as told by your health care provider.  Keep all follow-up visits as told by your health care provider. This is important. Contact a health care provider if you:  Notice a change in the rate, rhythm, or strength of your heartbeat.  Are taking a blood thinner and you notice more bruising.  Have a sudden change in weight.  Tire more easily when you exercise or do heavy work. Get help right away if you have:  Pain or pressure in your chest.  Shortness of breath.  Fainting.  Increasing sweating with no known cause.  Side effects of blood thinners, such as blood in your vomit, stool, or urine, or bleeding that cannot stop.  Any symptoms of a stroke. "BE FAST" is an easy way to remember the main warning signs of a stroke: ? B - Balance. Signs are dizziness, sudden trouble walking, or loss of balance. ? E - Eyes. Signs are trouble seeing or a sudden change in vision. ? F - Face. Signs are sudden weakness or numbness of the face, or the face or eyelid drooping on one side. ? A - Arms. Signs are weakness or numbness in an arm. This happens suddenly and usually on one side of the body. ? S - Speech. Signs are sudden trouble speaking, slurred speech, or trouble understanding what people say. ? T - Time. Time to call emergency services. Write down what time symptoms started.  Other signs of a stroke, such as: ? A sudden, severe headache with no known cause. ? Nausea or vomiting. ? Seizure.  These symptoms may represent a serious problem that is an emergency. Do not wait to see if the symptoms will go away. Get medical help right away. Call your local emergency services (911 in the [place] Do not drive yourself to the hospital. Summary  Atrial  flutter is an abnormal heart rhythm that can give you symptoms of palpitations, shortness of breath, or fatigue.  Atrial flutter is often treated with medicines to keep your heart in a normal rhythm and to prevent a stroke.  Get help right away if you cannot catch your breath, or have chest pain or pressure.  Get help right away if you have signs or symptoms of a stroke. This information is not intended to replace advice given to you by your health care provider. Make sure you discuss any questions you have with your health care provider. Document Revised: 10/03/2018 Document Reviewed: 10/03/2018 Elsevier Patient Education  [place].   Hospital doctor cardioversion is the delivery of a jolt of electricity to restore a normal rhythm to the heart. A rhythm that is too fast or is not regular keeps the heart from pumping well. In this procedure, sticky patches or metal paddles are placed on the chest to deliver electricity to the heart from a device. This procedure may be done in an emergency if:  There is low or no blood pressure as a result of the heart rhythm.  Normal rhythm must be restored as fast as possible to protect the brain and heart from further damage.  It may save a life. This may also be a scheduled procedure for irregular or fast heart rhythms that are not immediately life-threatening. Tell a health care provider about:  Any allergies you have.  All medicines you are taking, including vitamins, herbs, eye drops, creams, and over-the-counter medicines.  Any problems you or family members have had with anesthetic medicines.  Any blood disorders you have.  Any surgeries you have had.  Any medical conditions you have.  Whether you are pregnant or may be pregnant. What are the risks? Generally, this is a safe procedure. However, problems may occur, including:  Allergic reactions to medicines.  A blood clot that breaks free and travels to  other parts of your body.  The possible return of an abnormal heart rhythm within hours or days after the procedure.  Your heart stopping (cardiac arrest). This is rare. What happens before the procedure? Medicines  Your health care provider may have you start taking: ? Blood-thinning medicines (anticoagulants) so your blood does not clot as easily. ? Medicines to help stabilize your heart rate and rhythm.  Ask your health care provider about: ? Changing or stopping your regular medicines. This is especially important if you are taking diabetes medicines or blood thinners. ? Taking medicines such as aspirin and ibuprofen. These medicines can thin your blood. Do not take these medicines unless your health care provider tells you to take them. ? Taking over-the-counter medicines, vitamins, herbs, and supplements. General instructions  Follow instructions from your health care provider about eating or drinking restrictions.  Plan to have someone take you home from the hospital or clinic.  If you will be going home right after the procedure, plan to have someone with you for 24 hours.  Ask your health care provider what steps will be taken to help prevent infection. These may include washing your skin with a germ-killing soap. What happens during the procedure?   An IV will be inserted into one of your veins.  Sticky patches (electrodes) or metal paddles may be placed on your chest.  You will be given a medicine to help you relax (sedative).  An electrical shock will be delivered. The procedure may vary among health care providers and hospitals. What can I expect after the procedure?  Your blood pressure, heart rate, breathing rate, and blood oxygen level will be monitored until you leave the hospital or clinic.  Your heart rhythm will be watched to make sure it does not change.  You may have some redness on the skin where the shocks were given. Follow these instructions at  home:  Do not drive for 24 hours if you were given a sedative during your procedure.  Take over-the-counter and prescription medicines only as told by your health care provider.  Ask your health care provider how to check your pulse. Check it often.  Rest for 48 hours after the procedure or as told by your health care provider.  Avoid or limit your caffeine use as told by your health care provider.  Keep all follow-up visits as told by your health care provider. This is important. Contact a health care provider if:  You feel like your heart is beating too quickly or your pulse is not regular.  You have a serious muscle cramp that does not go away. Get help right away if:  You have discomfort in your chest.  You are dizzy or you feel faint.  You have trouble breathing or you are short of breath.  Your speech is slurred.  You have trouble moving an arm or leg on one side of your body.  Your fingers or toes turn cold or blue. Summary  Electrical cardioversion is the delivery of a jolt of electricity to restore a normal rhythm to the heart.  This procedure may be done right away in an emergency or may be a scheduled procedure if the condition is not an emergency.  Generally, this is a safe procedure.  After the procedure, check your pulse often as told by your health care provider. This information is not intended to replace advice given to you by your health care provider. Make sure you discuss any questions you have with your health care provider. Document Revised: 11/12/2018 Document Reviewed: 11/12/2018 Elsevier Patient Education  [place].

## 2020-02-06 NOTE — CV Procedure (Signed)
Elective direct-current cardioversion  Indication: Persistent atrial flutter  Description of procedure: After informed consent was obtained, patient was taken to the procedure suite where a timeout was performed.  Anterior and posterior pads were placed in standard fashion and connected to a biphasic defibrillator.  Deep sedation was achieved via the anesthesia service with use of propofol, please refer to their records for dosing information.  With anterior sandbag in place, a single synchronized shock at 120 J was delivered with successful restoration of sinus rhythm.  Patient remained hemodynamically stable throughout and there were no immediate complications.  Sinus rhythm confirmed by ECG.  Satira Sark, M.D., F.A.C.C.

## 2020-02-07 ENCOUNTER — Encounter (HOSPITAL_COMMUNITY): Payer: Self-pay | Admitting: Cardiology

## 2020-03-01 NOTE — Progress Notes (Signed)
Cardiology Office Note  Date: 03/02/2020   ID: Christopher Boyer, DOB [birth year], MRN [id number]  PCP:  [facility]  Cardiologist:  No primary care provider on file. Electrophysiologist:  None   Chief Complaint: Hospital follow up   History of Present Illness: Christopher Boyer is a 49 y.o. male with a history of morbid obesity, GAD, CKD Stage IV, HTN, HLD, Vitamin D deficiency. Recent dx of COVID pneumonia hospitalized 12/20/2019.   Admitted 12/22/2019: Woke up in am with chest pain. He had been discharged the prior day due to Covid pneumonia  infection and returned with atrial fibrillation with RVR and started on Cardizem gtt. Echocardiogram EF 55-60%. He was seen by cardiology and was to have possible cardioversion in the outpatient setting.  He was continued on Cardizem 180 mg CD daily, he was started on Eliquis 5 mg p.o. twice daily.  He was discharged on metoprolol 100 mg p.o. twice daily.   He presented previous visit for hospital follow-up stating he was feeling much better.  He denied any other Covid symptoms such as shortness of breath/dyspnea, coughing, fever, chills.  Stated he finished his remdesivir and prednisone.  He denied any current palpitations or arrhythmias, orthostatic symptoms, stroke or TIA-like symptoms, PND, orthopnea, claudication-like symptoms, DVT or PE-like symptoms, lower extremity edema.  He stated he stopped his Eliquis due to bruising and bleeding from hemorrhoids.  He has chronic kidney disease stage IV with a creatinine of 3.19.  He was following with nephrology.  He presented last visit for follow-up.  He stated he had been religiously taking his Eliquis without interruption.  Discussed the process of DCCV , risk and benefits.  Patient was willing to undergo DC cardioversion.   Patient is here for follow-up status post DC cardioversion.  Had DCCV on 02/06/2020: 120 J x 1 with conversion to NSR.  EKG today shows sinus  bradycardia rate of 55, RBBB.  He denies any recent issues since cardioversion.  States he is feeling much better.  He has already returned to work.  No complaints of palpitations or arrhythmias, orthostatic symptoms, bleeding issues, anginal or exertional symptoms, PND, orthopnea, CVA or TIA-like symptoms, claudication-like symptoms, DVT or PE-like symptoms, or lower extremity edema.  Past Medical History:  Diagnosis Date  . Atrial fibrillation ([place]) 08/23/2019  . Chronic kidney disease (CKD), stage III (moderate) (HCC)   . Gout due to renal impairment   . Hypertension   . Leg edema   . Obesity     Past Surgical History:  Procedure Laterality Date  . CARDIOVERSION N/A 02/06/2020   Procedure: CARDIOVERSION;  Surgeon: Satira Sark, MD;  Location: [facility];  Service: Cardiovascular;  Laterality: N/A;  . RENAL BIOPSY      Current Outpatient Medications  Medication Sig Dispense Refill  . ALPRAZolam (XANAX) 0.5 MG tablet Take 1 mg by mouth at bedtime.     Marland Kitchen apixaban (ELIQUIS) 5 MG TABS tablet Take 1 tablet (5 mg total) by mouth 2 (two) times daily. 60 tablet 6  . buPROPion (WELLBUTRIN XL) 300 MG 24 hr tablet Take 300 mg by mouth daily.     . cetirizine (ZYRTEC) 10 MG tablet Take 10 mg by mouth daily.    . Cholecalciferol (DIALYVITE VITAMIN D 5000) 125 MCG (5000 UT) capsule Take 5,000 Units by mouth at bedtime.    Marland Kitchen escitalopram (LEXAPRO) 10 MG tablet Take 10 mg by mouth daily. Take with the 20 mg to  equal 30 mg daily    . escitalopram (LEXAPRO) 20 MG tablet Take 20 mg by mouth daily. Take with 10 mg to equal 30 mg daily    . fenofibrate 160 MG tablet Take 160 mg by mouth at bedtime.     . metoprolol tartrate (LOPRESSOR) 100 MG tablet Take 100 mg by mouth 2 (two) times daily.    Marland Kitchen omeprazole (PRILOSEC) 20 MG capsule Take 40 mg by mouth daily.     Marland Kitchen triamcinolone ointment (KENALOG) 0.5 % Apply 1 application topically daily as needed (psoriasis).      No current  facility-administered medications for this visit.   Allergies:  Septra [sulfamethoxazole-trimethoprim]   Social History: The patient  reports that he has never smoked. He has never used smokeless tobacco. He reports that he does not drink alcohol and does not use drugs.   Family History: The patient's family history includes Atrial fibrillation in his father and mother; Valvular heart disease in his mother.   ROS:  Please see the history of present illness. Otherwise, complete review of systems is positive for none.  All other systems are reviewed and negative.   Physical Exam: VS:  BP 120/80   Pulse 60   Ht 6\' 2"  (1.88 m)   Wt (!) 344 lb (156 kg)   SpO2 98%   BMI 44.17 kg/m , BMI Body mass index is 44.17 kg/m.  Wt Readings from Last 3 Encounters:  03/02/20 (!) 344 lb (156 kg)  02/04/20 (!) 340 lb (154.2 kg)  01/27/20 (!) 344 lb (156 kg)    General: Morbidly obese patient appears comfortable at rest. Neck: Supple, no elevated JVP or carotid bruits, no thyromegaly. Lungs: Clear to auscultation, nonlabored breathing at rest. Cardiac: Irregularly irregular and rhythm, no S3 or significant systolic murmur, no pericardial rub. Extremities: No pitting edema, distal pulses 2+. Skin: Warm and dry. Musculoskeletal: No kyphosis. Neuropsychiatric: Alert and oriented x3, affect grossly appropriate.  ECG:  An ECG dated March 02, 2020 was personally reviewed today and demonstrated:  Sinus bradycardia with right bundle branch block rate of 55.  Recent Labwork: 12/22/2019: TSH 0.435 12/24/2019: ALT 36; AST 31; Hemoglobin 13.1; Magnesium 2.5; Platelets 213 02/04/2020: BUN 50; Creatinine, Ser 3.68; Potassium 4.7; Sodium 141   Recent renal function on lab work dated 01/23/2020 showed creatinine of 3.70, estimated GFR 18.  CBC showed hemoglobin of 12 and hematocrit of 37.      Component Value Date/Time   TRIG 200 (H) 12/20/2019 5188    Other Studies Reviewed Today:  Echocardiogram  12/23/2019 1. Left ventricular ejection fraction, by estimation, is 55 to 60%. The left ventricle has normal function. The left ventricle has no regional wall motion abnormalities. There is moderate left ventricular hypertrophy. Left ventricular diastolic parameters are indeterminate. 2. Right ventricular systolic function is normal. The right ventricular size is normal. 3. Left atrial size was mildly dilated. 4. The mitral valve is normal in structure. No evidence of mitral valve regurgitation. No evidence of mitral stenosis. 5. The aortic valve is tricuspid. Aortic valve regurgitation is not visualized. No aortic stenosis is present. 6. The inferior vena cava is normal in size with greater than 50% respiratory variability, suggesting right atrial pressure of 3 mmHg.  Assessment and Plan:   1. Atrial fibrillation with RVR ([place]) Recent admission for new onset atrial fibrillation with RVR.  Was started on a Cardizem drip along with metoprolol.  He was discharged on Eliquis 5 mg p.o. twice daily, diltiazem 180  mg p.o. daily along with metoprolol 100 mg p.o. twice daily.  Patient had a successful cardioversion on February 06, 2020.  He remains in sinus today with EKG showing sinus bradycardia rate of 55 with right bundle branch block..  Continue Eliquis 5 mg p.o. twice daily.  Continue metoprolol 100 mg p.o. twice daily.  Diltiazem has been discontinued  2. Benign essential HTN Continue metoprolol 100 mg p.o. twice daily.  Blood pressure today is 120/80.  3. CKD (chronic kidney disease) stage 4, GFR 15-29 ml/min (HCC) History of stage IV CKD.  He sees nephrology.  Recent labs on February 04, 2020 showed creatinine 3.68.  Estimated GFR 18.  4. Pneumonia due to COVID-19 virus Recent hospital admission for Covid pneumonia.  Patient finished his course of remdesivir and prednisone outpatient.  He has no symptoms.   Medication Adjustments/Labs and Tests Ordered: Current medicines are reviewed at  length with the patient today.  Concerns regarding medicines are outlined above.   Disposition: Follow-up with Dr. Harl Bowie or [facility] 6 months.  Signed, Christopher July, NP 03/02/2020 8:18 AM    [facility] [place], [place], [place] [address] Phone: [phone]; Fax: [phone]

## 2020-03-02 ENCOUNTER — Ambulatory Visit (INDEPENDENT_AMBULATORY_CARE_PROVIDER_SITE_OTHER): Payer: 59 | Admitting: Family Medicine

## 2020-03-02 ENCOUNTER — Encounter: Payer: Self-pay | Admitting: Family Medicine

## 2020-03-02 VITALS — BP 120/80 | HR 60 | Ht 74.0 in | Wt 344.0 lb

## 2020-03-02 DIAGNOSIS — I4891 Unspecified atrial fibrillation: Secondary | ICD-10-CM | POA: Diagnosis not present

## 2020-03-02 NOTE — Patient Instructions (Signed)

## 2020-04-14 ENCOUNTER — Other Ambulatory Visit: Payer: Self-pay | Admitting: Family Medicine

## 2020-04-14 DIAGNOSIS — F331 Major depressive disorder, recurrent, moderate: Secondary | ICD-10-CM

## 2020-08-31 NOTE — Progress Notes (Signed)
Cardiology Office Note  Date: 09/01/2020   ID: Christopher Boyer, DOB February 09, 1971, MRN [id number]  PCP:  [facility]  Cardiologist:  None Electrophysiologist:  None   Chief Complaint: Follow up   History of Present Illness: Christopher Boyer is a 50 y.o. male with a history of morbid obesity, GAD, CKD Stage IV, HTN, HLD, Vitamin D deficiency. Recent dx of COVID pneumonia hospitalized 12/20/2019.   Admitted 12/22/2019: Woke up in am with chest pain. He had been discharged the prior day due to Covid pneumonia  infection and returned with atrial fibrillation with RVR and started on Cardizem gtt. Echocardiogram EF 55-60%. He was seen by cardiology and was to have possible cardioversion in the outpatient setting.  He was continued on Cardizem 180 mg CD daily, he was started on Eliquis 5 mg p.o. twice daily.  He was discharged on metoprolol 100 mg p.o. twice daily.  He was last here for follow-up status post DC cardioversion.  Had DCCV on 02/06/2020: 120 J x 1 with conversion to NSR.  EKG today shows sinus bradycardia rate of 55, RBBB.  He denied any recent issues since cardioversion.  He was feeling much better.  He had returned to work.  No complaints of palpitations or arrhythmias, orthostatic symptoms, bleeding issues, anginal or exertional symptoms, PND, orthopnea, CVA or TIA-like symptoms, claudication-like symptoms, DVT or PE-like symptoms, or lower extremity edema.  He is here for follow-up today he states she is back at work and doing well.  Not having any issues with palpitations or arrhythmias.  No anginal or exertional symptoms, no lightheadedness, dizziness, presyncopal or syncopal episodes.  No DOE or SOB.  No PND or orthopnea.  No CVA or TIA-like symptoms, no bleeding issues.  No claudication-like symptoms, DVT or PE-like symptoms, or lower extremity edema.  Blood pressure appears to be well controlled.  He has gained some weight since last visit and  November 2021 approximately 18 pounds.  He states this is due to eating.  He is compliant with all of his medications.  There have been no medication changes.  He is continuing on metoprolol 100 mg p.o. twice daily, Eliquis 5 mg p.o. twice daily.   Past Medical History:  Diagnosis Date  . Atrial fibrillation ([place]) 08/23/2019  . Chronic kidney disease (CKD), stage III (moderate) (HCC)   . Gout due to renal impairment   . Hypertension   . Leg edema   . Obesity     Past Surgical History:  Procedure Laterality Date  . CARDIOVERSION N/A 02/06/2020   Procedure: CARDIOVERSION;  Surgeon: Satira Sark, MD;  Location: [facility];  Service: Cardiovascular;  Laterality: N/A;  . RENAL BIOPSY      Current Outpatient Medications  Medication Sig Dispense Refill  . ALPRAZolam (XANAX) 0.5 MG tablet Take 1 mg by mouth at bedtime.     Marland Kitchen apixaban (ELIQUIS) 5 MG TABS tablet Take 1 tablet (5 mg total) by mouth 2 (two) times daily. 60 tablet 6  . cetirizine (ZYRTEC) 10 MG tablet Take 10 mg by mouth daily.    . Cholecalciferol (DIALYVITE VITAMIN D 5000) 125 MCG (5000 UT) capsule Take 5,000 Units by mouth at bedtime.    Marland Kitchen escitalopram (LEXAPRO) 10 MG tablet Take 10 mg by mouth daily. Take with the 20 mg to equal 30 mg daily    . escitalopram (LEXAPRO) 20 MG tablet Take 20 mg by mouth daily. Take with 10 mg to equal  30 mg daily    . fenofibrate 160 MG tablet Take 160 mg by mouth at bedtime.     . metoprolol tartrate (LOPRESSOR) 100 MG tablet Take 100 mg by mouth 2 (two) times daily.    Marland Kitchen omeprazole (PRILOSEC) 20 MG capsule Take 40 mg by mouth daily.     Marland Kitchen triamcinolone ointment (KENALOG) 0.5 % Apply 1 application topically daily as needed (psoriasis).      No current facility-administered medications for this visit.   Allergies:  Septra [sulfamethoxazole-trimethoprim]   Social History: The patient  reports that he has never smoked. He has never used smokeless tobacco. He reports that he does not  drink alcohol and does not use drugs.   Family History: The patient's family history includes Atrial fibrillation in his father and mother; Valvular heart disease in his mother.   ROS:  Please see the history of present illness. Otherwise, complete review of systems is positive for none.  All other systems are reviewed and negative.   Physical Exam: VS:  BP 118/86   Pulse (!) 55   Ht 6' (1.829 m)   Wt (!) 362 lb 3.2 oz (164.3 kg)   SpO2 95%   BMI 49.12 kg/m , BMI Body mass index is 49.12 kg/m.  Wt Readings from Last 3 Encounters:  09/01/20 (!) 362 lb 3.2 oz (164.3 kg)  03/02/20 (!) 344 lb (156 kg)  02/04/20 (!) 340 lb (154.2 kg)    General: Morbidly obese patient appears comfortable at rest. Neck: Supple, no elevated JVP or carotid bruits, no thyromegaly. Lungs: Clear to auscultation, nonlabored breathing at rest. Cardiac: Regular rate and rhythm, no S3 or significant systolic murmur, no pericardial rub. Extremities: No pitting edema, distal pulses 2+. Skin: Warm and dry. Musculoskeletal: No kyphosis. Neuropsychiatric: Alert and oriented x3, affect grossly appropriate.  ECG:  An ECG dated March 02, 2020 was personally reviewed today and demonstrated:  Sinus bradycardia with right bundle branch block rate of 55.  Recent Labwork: 12/22/2019: TSH 0.435 12/24/2019: ALT 36; AST 31; Hemoglobin 13.1; Magnesium 2.5; Platelets 213 02/04/2020: BUN 50; Creatinine, Ser 3.68; Potassium 4.7; Sodium 141   Recent renal function on lab work dated 01/23/2020 showed creatinine of 3.70, estimated GFR 18.  CBC showed hemoglobin of 12 and hematocrit of 37.      Component Value Date/Time   TRIG 200 (H) 12/20/2019 [id number]    Other Studies Reviewed Today:  Echocardiogram 12/23/2019 1. Left ventricular ejection fraction, by estimation, is 55 to 60%. The left ventricle has normal function. The left ventricle has no regional wall motion abnormalities. There is moderate left ventricular hypertrophy.  Left ventricular diastolic parameters are indeterminate. 2. Right ventricular systolic function is normal. The right ventricular size is normal. 3. Left atrial size was mildly dilated. 4. The mitral valve is normal in structure. No evidence of mitral valve regurgitation. No evidence of mitral stenosis. 5. The aortic valve is tricuspid. Aortic valve regurgitation is not visualized. No aortic stenosis is present. 6. The inferior vena cava is normal in size with greater than 50% respiratory variability, suggesting right atrial pressure of 3 mmHg.  Assessment and Plan:   1. Atrial fibrillation  Recent admission for new onset atrial fibrillation with RVR.  He was started on a Cardizem drip along with metoprolol.  He was discharged on Eliquis 5 mg p.o. twice daily, diltiazem 180 mg p.o. daily along with metoprolol 100 mg p.o. twice daily.  Patient had a successful cardioversion on February 06, 2020.  At last visit he remained in sinus with EKG showing sinus bradycardia rate of 55 with right bundle branch block..  Continue Eliquis 5 mg p.o. twice daily.  Continue metoprolol 100 mg p.o. twice daily.  Diltiazem has been discontinued.  Heart rate today as 55 and regular.  2. Benign essential HTN Continue metoprolol 100 mg p.o. twice daily.  Blood pressure today is 118/86.  3. CKD (chronic kidney disease) stage 4, GFR 15-29 ml/min (HCC) History of stage IV CKD.  He sees nephrology.  Recent labs on February 04, 2020 showed creatinine 3.68.  Estimated GFR 18.   Medication Adjustments/Labs and Tests Ordered: Current medicines are reviewed at length with the patient today.  Concerns regarding medicines are outlined above.   Disposition: Follow-up with Dr. Harl Bowie or APP 6 months.  Signed, Levell July, NP 09/01/2020 8:26 AM    [facility], [place], [place] [address] Phone: [phone]; Fax: [phone]

## 2020-09-01 ENCOUNTER — Ambulatory Visit: Payer: 59 | Admitting: Family Medicine

## 2020-09-01 ENCOUNTER — Encounter: Payer: Self-pay | Admitting: Family Medicine

## 2020-09-01 VITALS — BP 118/86 | HR 55 | Ht 72.0 in | Wt 362.2 lb

## 2020-09-01 DIAGNOSIS — N184 Chronic kidney disease, stage 4 (severe): Secondary | ICD-10-CM | POA: Diagnosis not present

## 2020-09-01 DIAGNOSIS — I10 Essential (primary) hypertension: Secondary | ICD-10-CM | POA: Diagnosis not present

## 2020-09-01 DIAGNOSIS — I4891 Unspecified atrial fibrillation: Secondary | ICD-10-CM

## 2020-09-01 NOTE — Patient Instructions (Signed)
Medication Instructions:  Continue all current medications.   Labwork: none  Testing/Procedures: none  Follow-Up: 6 months   Any Other Special Instructions Will Be Listed Below (If Applicable).   If you need a refill on your cardiac medications before your next appointment, please call your pharmacy.  

## 2020-09-14 ENCOUNTER — Ambulatory Visit: Payer: 59 | Admitting: Cardiology

## 2021-12-09 LAB — COLOGUARD: COLOGUARD: POSITIVE — AB

## 2022-02-18 IMAGING — DX DG CHEST 1V PORT
1 series · 1 of 1 positions shown · non-contrast
Comparison: Chest radiograph dated 12/20/2019

CLINICAL DATA: Chest pain, COVID positive

EXAM:
PORTABLE CHEST 1 VIEW

[chest ap]
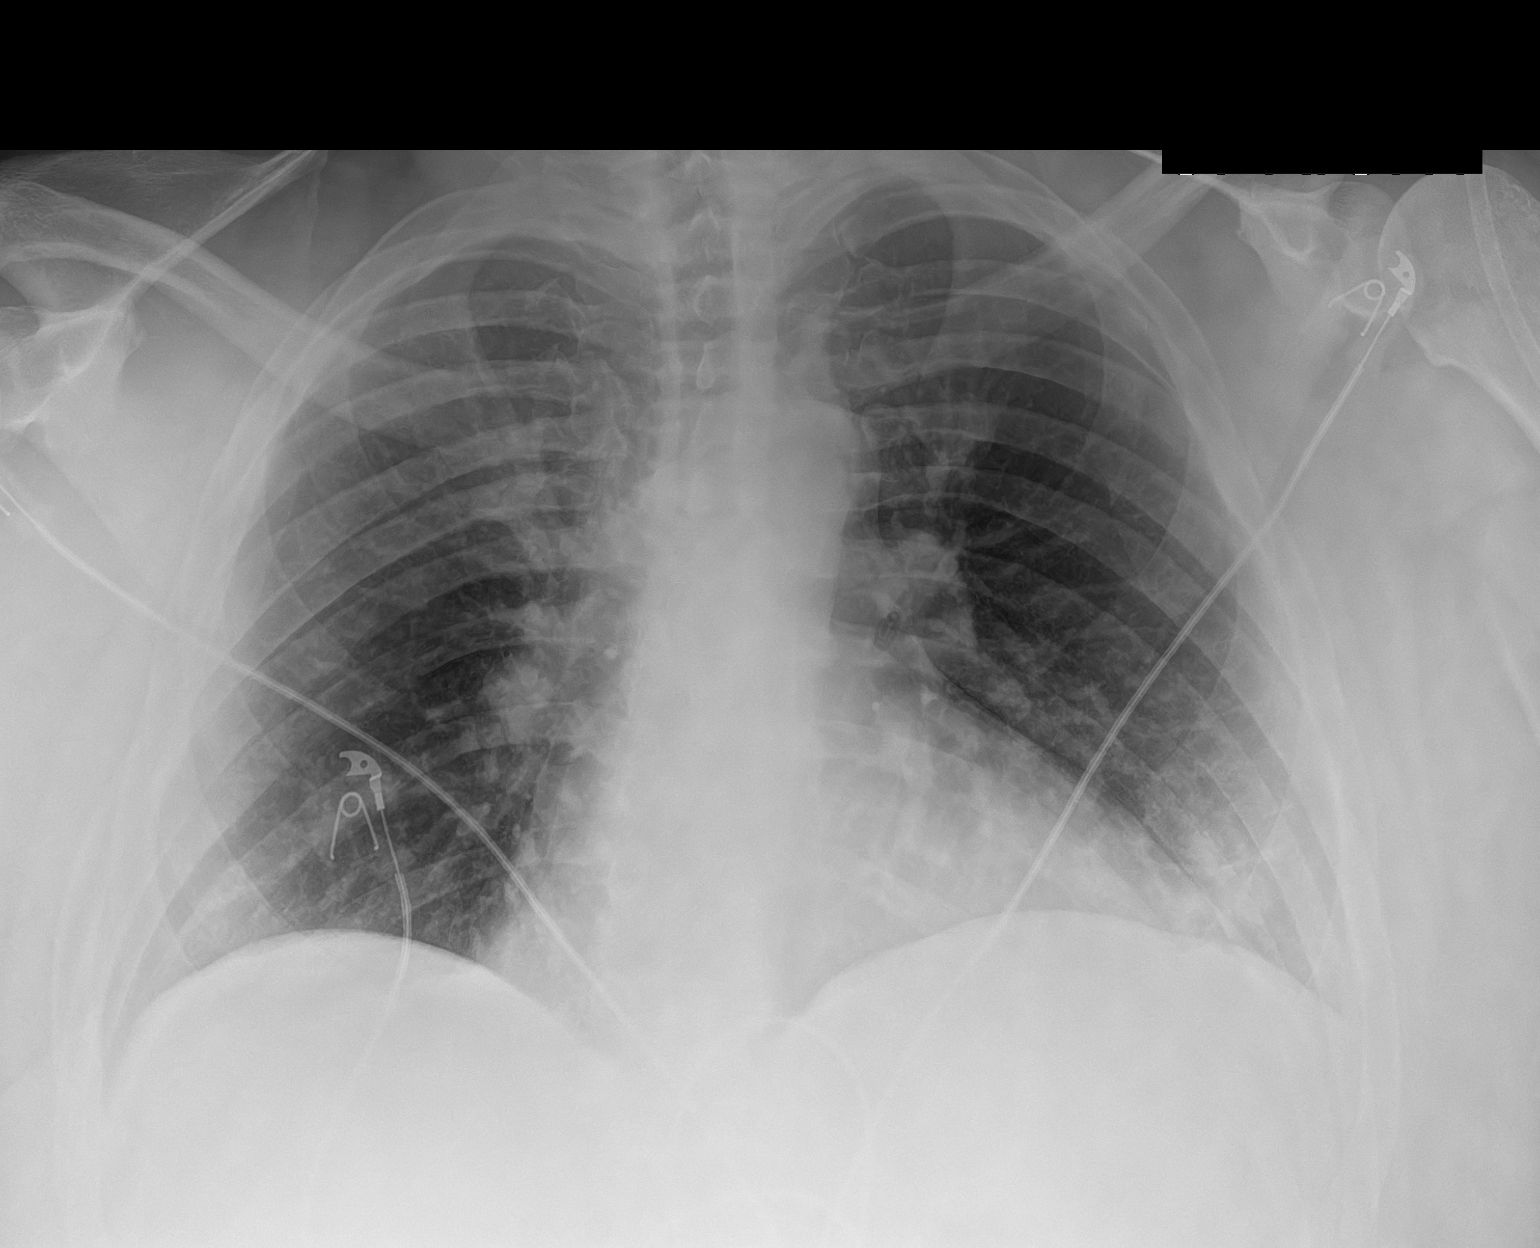

[1 of 1 positions shown; findings below may reference images not displayed]

FINDINGS: The heart size and mediastinal contours are within normal limits.
Mild airspace opacities in the lower lungs, left greater than right,
have increased since 12/20/2019. There is no pleural effusion or
pneumothorax. The visualized skeletal structures are unremarkable.
IMPRESSION: Mild airspace opacities in the lower lungs, left greater than right,
have increased since 12/20/2019.

## 2023-09-24 ENCOUNTER — Emergency Department (HOSPITAL_COMMUNITY)

## 2023-09-24 ENCOUNTER — Encounter (HOSPITAL_COMMUNITY): Payer: Self-pay | Admitting: Emergency Medicine

## 2023-09-24 ENCOUNTER — Inpatient Hospital Stay (HOSPITAL_COMMUNITY)
Admission: EM | Admit: 2023-09-24 | Discharge: 2023-09-27 | DRG: 872 | Disposition: A | Discharge status: Home / Self Care | Attending: Internal Medicine | Admitting: Internal Medicine

## 2023-09-24 ENCOUNTER — Other Ambulatory Visit: Payer: Self-pay

## 2023-09-24 DIAGNOSIS — Z6841 Body Mass Index (BMI) 40.0 and over, adult: Secondary | ICD-10-CM

## 2023-09-24 DIAGNOSIS — A4151 Sepsis due to Escherichia coli [E. coli]: Secondary | ICD-10-CM | POA: Diagnosis present

## 2023-09-24 DIAGNOSIS — Z1152 Encounter for screening for COVID-19: Secondary | ICD-10-CM | POA: Diagnosis not present

## 2023-09-24 DIAGNOSIS — I483 Typical atrial flutter: Principal | ICD-10-CM | POA: Diagnosis present

## 2023-09-24 DIAGNOSIS — Z8249 Family history of ischemic heart disease and other diseases of the circulatory system: Secondary | ICD-10-CM

## 2023-09-24 DIAGNOSIS — E785 Hyperlipidemia, unspecified: Secondary | ICD-10-CM | POA: Diagnosis present

## 2023-09-24 DIAGNOSIS — N179 Acute kidney failure, unspecified: Secondary | ICD-10-CM | POA: Diagnosis present

## 2023-09-24 DIAGNOSIS — F419 Anxiety disorder, unspecified: Secondary | ICD-10-CM | POA: Diagnosis present

## 2023-09-24 DIAGNOSIS — A419 Sepsis, unspecified organism: Secondary | ICD-10-CM | POA: Diagnosis present

## 2023-09-24 DIAGNOSIS — N390 Urinary tract infection, site not specified: Secondary | ICD-10-CM | POA: Diagnosis present

## 2023-09-24 DIAGNOSIS — Z7901 Long term (current) use of anticoagulants: Secondary | ICD-10-CM

## 2023-09-24 DIAGNOSIS — R519 Headache, unspecified: Secondary | ICD-10-CM | POA: Diagnosis present

## 2023-09-24 DIAGNOSIS — R0982 Postnasal drip: Secondary | ICD-10-CM | POA: Diagnosis present

## 2023-09-24 DIAGNOSIS — I129 Hypertensive chronic kidney disease with stage 1 through stage 4 chronic kidney disease, or unspecified chronic kidney disease: Secondary | ICD-10-CM | POA: Diagnosis present

## 2023-09-24 DIAGNOSIS — R0981 Nasal congestion: Secondary | ICD-10-CM | POA: Diagnosis present

## 2023-09-24 DIAGNOSIS — E66813 Obesity, class 3: Secondary | ICD-10-CM | POA: Insufficient documentation

## 2023-09-24 DIAGNOSIS — R509 Fever, unspecified: Secondary | ICD-10-CM | POA: Diagnosis not present

## 2023-09-24 DIAGNOSIS — N184 Chronic kidney disease, stage 4 (severe): Secondary | ICD-10-CM | POA: Diagnosis present

## 2023-09-24 DIAGNOSIS — F32A Depression, unspecified: Secondary | ICD-10-CM | POA: Diagnosis present

## 2023-09-24 DIAGNOSIS — I4891 Unspecified atrial fibrillation: Secondary | ICD-10-CM | POA: Diagnosis present

## 2023-09-24 DIAGNOSIS — I4510 Unspecified right bundle-branch block: Secondary | ICD-10-CM | POA: Diagnosis present

## 2023-09-24 DIAGNOSIS — Z882 Allergy status to sulfonamides status: Secondary | ICD-10-CM

## 2023-09-24 DIAGNOSIS — Z79899 Other long term (current) drug therapy: Secondary | ICD-10-CM

## 2023-09-24 LAB — CBC WITH DIFFERENTIAL/PLATELET
Abs Immature Granulocytes: 0.1 10*3/uL — ABNORMAL HIGH (ref 0.00–0.07)
Basophils Absolute: 0.1 10*3/uL (ref 0.0–0.1)
Basophils Relative: 1 %
Eosinophils Absolute: 0 10*3/uL (ref 0.0–0.5)
Eosinophils Relative: 0 %
HCT: 37.7 % — ABNORMAL LOW (ref 39.0–52.0)
Hemoglobin: 12.3 g/dL — ABNORMAL LOW (ref 13.0–17.0)
Immature Granulocytes: 1 %
Lymphocytes Relative: 6 %
Lymphs Abs: 0.6 10*3/uL — ABNORMAL LOW (ref 0.7–4.0)
MCH: 29.8 pg (ref 26.0–34.0)
MCHC: 32.6 g/dL (ref 30.0–36.0)
MCV: 91.3 fL (ref 80.0–100.0)
Monocytes Absolute: 1.2 10*3/uL — ABNORMAL HIGH (ref 0.1–1.0)
Monocytes Relative: 11 %
Neutro Abs: 8.3 10*3/uL — ABNORMAL HIGH (ref 1.7–7.7)
Neutrophils Relative %: 81 %
Platelets: 161 10*3/uL (ref 150–400)
RBC: 4.13 MIL/uL — ABNORMAL LOW (ref 4.22–5.81)
RDW: 13 % (ref 11.5–15.5)
WBC: 10.2 10*3/uL (ref 4.0–10.5)
nRBC: 0 % (ref 0.0–0.2)

## 2023-09-24 LAB — URINALYSIS, W/ REFLEX TO CULTURE (INFECTION SUSPECTED)
Bilirubin Urine: NEGATIVE
Glucose, UA: NEGATIVE mg/dL
Ketones, ur: NEGATIVE mg/dL
Nitrite: NEGATIVE
Protein, ur: 100 mg/dL — AB
RBC / HPF: >50 RBC/hpf (ref 0–5)
Specific Gravity, Urine: 1.009 (ref 1.005–1.030)
WBC, UA: >50 WBC/hpf (ref 0–5)
pH: 5 (ref 5.0–8.0)

## 2023-09-24 LAB — RAPID URINE DRUG SCREEN, HOSP PERFORMED
Amphetamines: NOT DETECTED
Barbiturates: NOT DETECTED
Benzodiazepines: POSITIVE — AB
Cocaine: NOT DETECTED
Opiates: NOT DETECTED
Tetrahydrocannabinol: NOT DETECTED

## 2023-09-24 LAB — BASIC METABOLIC PANEL WITH GFR
Anion gap: 11 (ref 5–15)
BUN: 50 mg/dL — ABNORMAL HIGH (ref 6–20)
CO2: 22 mmol/L (ref 22–32)
Calcium: 8.9 mg/dL (ref 8.9–10.3)
Chloride: 97 mmol/L — ABNORMAL LOW (ref 98–111)
Creatinine, Ser: 5.45 mg/dL — ABNORMAL HIGH (ref 0.61–1.24)
GFR, Estimated: 12 mL/min — ABNORMAL LOW (ref >60)
Glucose, Bld: 124 mg/dL — ABNORMAL HIGH (ref 70–99)
Potassium: 4.4 mmol/L (ref 3.5–5.1)
Sodium: 130 mmol/L — ABNORMAL LOW (ref 135–145)

## 2023-09-24 LAB — PROTIME-INR
INR: 1.7 — ABNORMAL HIGH (ref 0.8–1.2)
Prothrombin Time: 20.4 s — ABNORMAL HIGH (ref 11.4–15.2)

## 2023-09-24 LAB — RESP PANEL BY RT-PCR (RSV, FLU A&B, COVID)  RVPGX2
Influenza A by PCR: NEGATIVE
Influenza B by PCR: NEGATIVE
Resp Syncytial Virus by PCR: NEGATIVE
SARS Coronavirus 2 by RT PCR: NEGATIVE

## 2023-09-24 LAB — PROCALCITONIN: Procalcitonin: 3.62 ng/mL

## 2023-09-24 LAB — LACTIC ACID, PLASMA
Lactic Acid, Venous: 0.9 mmol/L (ref 0.5–1.9)
Lactic Acid, Venous: 0.9 mmol/L (ref 0.5–1.9)

## 2023-09-24 LAB — HEMOGLOBIN A1C
Hgb A1c MFr Bld: 5.1 % (ref 4.8–5.6)
Mean Plasma Glucose: 99.67 mg/dL

## 2023-09-24 LAB — BRAIN NATRIURETIC PEPTIDE: B Natriuretic Peptide: 125 pg/mL — ABNORMAL HIGH (ref 0.0–100.0)

## 2023-09-24 LAB — TSH: TSH: 1.986 u[IU]/mL (ref 0.350–4.500)

## 2023-09-24 LAB — T4, FREE: Free T4: 1.39 ng/dL — ABNORMAL HIGH (ref 0.61–1.12)

## 2023-09-24 LAB — TROPONIN I (HIGH SENSITIVITY)
Troponin I (High Sensitivity): 7 ng/L (ref <18)
Troponin I (High Sensitivity): 7 ng/L (ref <18)

## 2023-09-24 LAB — FOLATE: Folate: 9.8 ng/mL (ref >5.9)

## 2023-09-24 LAB — HIV ANTIBODY (ROUTINE TESTING W REFLEX): HIV Screen 4th Generation wRfx: NONREACTIVE

## 2023-09-24 LAB — VITAMIN B12: Vitamin B-12: 236 pg/mL (ref 180–914)

## 2023-09-24 MED ORDER — ESCITALOPRAM OXALATE 10 MG PO TABS
10.0000 mg | ORAL_TABLET | Freq: Every day | ORAL | Status: DC
  Filled 2023-09-24: qty 1

## 2023-09-24 MED ORDER — METOPROLOL TARTRATE 25 MG PO TABS
25.0000 mg | ORAL_TABLET | Freq: Four times a day (QID) | ORAL | Status: DC
  Administered 2023-09-24 – 2023-09-26 (×7): 25 mg via ORAL
  Filled 2023-09-24 (×8): qty 1

## 2023-09-24 MED ORDER — ORAL CARE MOUTH RINSE: 15.0000 mL | OROMUCOSAL | Status: DC | PRN

## 2023-09-24 MED ORDER — DULOXETINE HCL 60 MG PO CPEP
120.0000 mg | ORAL_CAPSULE | Freq: Every day | ORAL | Status: DC
  Administered 2023-09-24 – 2023-09-27 (×4): 120 mg via ORAL
  Filled 2023-09-24 (×4): qty 2

## 2023-09-24 MED ORDER — ALPRAZOLAM 0.5 MG PO TABS
0.5000 mg | ORAL_TABLET | Freq: Two times a day (BID) | ORAL | Status: DC | PRN
  Administered 2023-09-24 – 2023-09-26 (×3): 0.5 mg via ORAL
  Filled 2023-09-24 (×3): qty 1

## 2023-09-24 MED ORDER — ONDANSETRON HCL 4 MG/2ML IJ SOLN
4.0000 mg | Freq: Four times a day (QID) | INTRAMUSCULAR | Status: DC | PRN
Start: 2023-09-24 — End: 2023-09-27

## 2023-09-24 MED ORDER — ACETAMINOPHEN 650 MG RE SUPP: 650.0000 mg | Freq: Four times a day (QID) | RECTAL | Status: DC | PRN

## 2023-09-24 MED ORDER — ACETAMINOPHEN 325 MG PO TABS
650.0000 mg | ORAL_TABLET | Freq: Four times a day (QID) | ORAL | Status: DC | PRN
  Administered 2023-09-24 – 2023-09-26 (×8): 650 mg via ORAL
  Filled 2023-09-24 (×8): qty 2

## 2023-09-24 MED ORDER — APIXABAN 5 MG PO TABS
5.0000 mg | ORAL_TABLET | Freq: Two times a day (BID) | ORAL | Status: DC
  Administered 2023-09-24 – 2023-09-27 (×7): 5 mg via ORAL
  Filled 2023-09-24 (×7): qty 1

## 2023-09-24 MED ORDER — BUPROPION HCL ER (XL) 300 MG PO TB24
300.0000 mg | ORAL_TABLET | Freq: Every day | ORAL | Status: DC
  Administered 2023-09-24 – 2023-09-27 (×4): 300 mg via ORAL
  Filled 2023-09-24 (×4): qty 1

## 2023-09-24 MED ORDER — PANTOPRAZOLE SODIUM 40 MG PO TBEC
40.0000 mg | DELAYED_RELEASE_TABLET | Freq: Every day | ORAL | Status: DC
  Administered 2023-09-24 – 2023-09-27 (×4): 40 mg via ORAL
  Filled 2023-09-24 (×4): qty 1

## 2023-09-24 MED ORDER — FENOFIBRATE 160 MG PO TABS: 160.0000 mg | ORAL_TABLET | Freq: Every day | ORAL | Status: DC

## 2023-09-24 MED ORDER — LORATADINE 10 MG PO TABS
10.0000 mg | ORAL_TABLET | Freq: Every day | ORAL | Status: DC
  Administered 2023-09-24 – 2023-09-27 (×4): 10 mg via ORAL
  Filled 2023-09-24 (×4): qty 1

## 2023-09-24 MED ORDER — ONDANSETRON HCL 4 MG PO TABS: 4.0000 mg | ORAL_TABLET | Freq: Four times a day (QID) | ORAL | Status: DC | PRN

## 2023-09-24 MED ORDER — ACETAMINOPHEN 325 MG PO TABS
650.0000 mg | ORAL_TABLET | Freq: Once | ORAL | Status: AC | PRN
  Administered 2023-09-24: 650 mg via ORAL
  Filled 2023-09-24: qty 2

## 2023-09-24 MED ORDER — FENOFIBRATE 54 MG PO TABS
54.0000 mg | ORAL_TABLET | Freq: Every day | ORAL | Status: DC
  Administered 2023-09-24 – 2023-09-26 (×3): 54 mg via ORAL
  Filled 2023-09-24 (×4): qty 1

## 2023-09-24 MED ORDER — SODIUM CHLORIDE 0.9 % IV SOLN: INTRAVENOUS | Status: AC

## 2023-09-24 NOTE — Progress Notes (Signed)
 Pt's only complaint since arriving to unit this am is frontal sinus headache. Medicated with tylenol  for relief. Pt is ambulatory without assistance in room. Pt's urine was cloudy brown on arrival to room, now is dark yellow and clear. IVF continue and pt drinking water without n/v or abd pain. Pt has had 2 loose (type 6) light brown/orange colored stools since arrival to unit. Low grade temp (100 F), other VSS.

## 2023-09-24 NOTE — ED Provider Notes (Signed)
  EMERGENCY DEPARTMENT AT [facility] Provider Note   CSN: [id number] Arrival date & time: 09/24/23  0442     History  Chief Complaint  Patient presents with   Headache   sinus drainage    Christopher Boyer is a 53 y.o. male.  Presents to the emergency department stating that he has been ill for a week.  Initially he thought he had food poisoning, had some diarrhea that resolved after he took Imodium .  He has developed sinus congestion and postnasal drip with cough and chest congestion.  Patient feels like his sinuses are causing headache as well.       Home Medications Prior to Admission medications   Medication Sig Start Date End Date Taking? Authorizing Provider  ALPRAZolam  (XANAX ) 0.5 MG tablet Take 1 mg by mouth at bedtime.  12/13/19   [provider]  apixaban  (ELIQUIS ) 5 MG TABS tablet Take 1 tablet (5 mg total) by mouth 2 (two) times daily. 01/06/20   Berlin Breen., NP  cetirizine (ZYRTEC) 10 MG tablet Take 10 mg by mouth daily.    [provider]  Cholecalciferol (DIALYVITE VITAMIN D 5000) 125 MCG (5000 UT) capsule Take 5,000 Units by mouth at bedtime.    [provider]  escitalopram  (LEXAPRO ) 10 MG tablet Take 10 mg by mouth daily. Take with the 20 mg to equal 30 mg daily 11/23/19   [provider]  escitalopram  (LEXAPRO ) 20 MG tablet Take 20 mg by mouth daily. Take with 10 mg to equal 30 mg daily 12/15/19   [provider]  fenofibrate  160 MG tablet Take 160 mg by mouth at bedtime.  12/01/19   [provider]  metoprolol  tartrate (LOPRESSOR ) 100 MG tablet Take 100 mg by mouth 2 (two) times daily. 11/26/19   [provider]  omeprazole (PRILOSEC) 20 MG capsule Take 40 mg by mouth daily.     [provider]  triamcinolone ointment (KENALOG) 0.5 % Apply 1 application topically daily as needed (psoriasis).  12/02/19   [provider]      Allergies    Septra  [sulfamethoxazole-trimethoprim]    Review of Systems   Review of Systems  Physical Exam Updated Vital Signs BP 125/80   Pulse 100   Temp (!) 103.2 F (39.6 C) (Oral)   Resp (!) 22   Ht 6' (1.829 m)   Wt (!) 158.8 kg   SpO2 93%   BMI 47.47 kg/m  Physical Exam Vitals and nursing note reviewed.  Constitutional:      General: He is not in acute distress.    Appearance: He is well-developed.  HENT:     Head: Normocephalic and atraumatic.     Mouth/Throat:     Mouth: Mucous membranes are moist.  Eyes:     General: Vision grossly intact. Gaze aligned appropriately.     Extraocular Movements: Extraocular movements intact.     Conjunctiva/sclera: Conjunctivae normal.  Cardiovascular:     Rate and Rhythm: Normal rate and regular rhythm.     Pulses: Normal pulses.     Heart sounds: Normal heart sounds, S1 normal and S2 normal. No murmur heard.    No friction rub. No gallop.  Pulmonary:     Effort: Pulmonary effort is normal. No respiratory distress.     Breath sounds: Normal breath sounds.  Abdominal:     Palpations: Abdomen is soft.     Tenderness: There is no abdominal tenderness. There is no  guarding or rebound.     Hernia: No hernia is present.  Musculoskeletal:        General: No swelling.     Cervical back: Full passive range of motion without pain, normal range of motion and neck supple. No pain with movement, spinous process tenderness or muscular tenderness. Normal range of motion.     Right lower leg: No edema.     Left lower leg: No edema.  Skin:    General: Skin is warm and dry.     Capillary Refill: Capillary refill takes less than 2 seconds.     Findings: No ecchymosis, erythema, lesion or wound.  Neurological:     Mental Status: He is alert and oriented to person, place, and time.     GCS: GCS eye subscore is 4. GCS verbal subscore is 5. GCS motor subscore is 6.     Cranial Nerves: Cranial nerves 2-12 are intact.     Sensory: Sensation is intact.     Motor:  Motor function is intact. No weakness or abnormal muscle tone.     Coordination: Coordination is intact.  Psychiatric:        Mood and Affect: Mood normal.        Speech: Speech normal.        Behavior: Behavior normal.     ED Results / Procedures / Treatments   Labs (all labs ordered are listed, but only abnormal results are displayed) Labs Reviewed  RESP PANEL BY RT-PCR (RSV, FLU A&B, COVID)  [id number]  CULTURE, BLOOD (ROUTINE X 2)  CULTURE, BLOOD (ROUTINE X 2)  CBC WITH DIFFERENTIAL/PLATELET  BASIC METABOLIC PANEL WITH GFR  BRAIN NATRIURETIC PEPTIDE  LACTIC ACID, PLASMA  LACTIC ACID, PLASMA  URINALYSIS, W/ REFLEX TO CULTURE (INFECTION SUSPECTED)  PROTIME-INR  TROPONIN I (HIGH SENSITIVITY)    EKG EKG Interpretation Date/Time:  Sunday September 24 2023 06:24:18 EDT Ventricular Rate:  102 PR Interval:    QRS Duration:  147 QT Interval:  436 QTC Calculation: 585 R Axis:   -38  Text Interpretation: Atrial flutter Right bundle branch block Confirmed by Ballard Bongo [id number]) on 09/24/2023 6:49:09 AM  Radiology No results found.  Procedures Procedures    Medications Ordered in ED Medications  acetaminophen  (TYLENOL ) tablet 650 mg (650 mg Oral Given 09/24/23 1191)    ED Course/ Medical Decision Making/ A&P                                 Medical Decision Making Amount and/or Complexity of Data Reviewed Labs: ordered. Radiology: ordered.   Differential Diagnosis considered includes, but not limited to: COVID-19; influenza; RSV; simple viral URI; strep pharyngitis; pneumonia; sepsis  Patient reports that he has not been feeling well for about a week.  He initially had diarrhea which has resolved.  Now patient experiencing headache, sinus drainage, chest congestion and cough.  Patient tachycardic at arrival.  EKG shows atrial flutter.  Reviewing records, patient does have a history of atrial fibrillation, is status post ablation in 2021.  He appears to be on  Eliquis  chronically.  Patient found to be febrile to 103.  Infectious sources will be evaluated.  CT head for headache.  No meningismus.  Will be signed out to oncoming ER physician to follow results.        Final Clinical Impression(s) / ED Diagnoses Final diagnoses:  Typical atrial flutter (HCC)  Bad headache  Febrile  illness, acute    Rx / DC Orders ED Discharge Orders     None         Denetria Luevanos, Marine Sia, MD 09/24/23 [phone]

## 2023-09-24 NOTE — ED Notes (Signed)
 Pt not in room.

## 2023-09-24 NOTE — Hospital Course (Signed)
 53 year old male with a history of hypertension, hyperlipidemia, atrial fibrillation, anxiety/depression, CKD stage IV, DVT, obesity presenting with generalized weakness for about a week.  The patient states that he began developing nausea on 09/19/2023.  He began developing loose stools on 09/20/2023.  He took some Imodium  and it improved.  He denied any hematochezia, melena, vomiting, frank abdominal pain.  He denies any new medications.  He denies eating any raw or undercooked foods.  He denies any recent travels. He has had some fevers and chills up to 100.1 F at home.  He denies any chest pain, shortness of breath, hemoptysis.  He has had nonproductive cough with chest congestion.  He has had a frontal headache.  Denies any neck pain or rashes.  He denies any dysuria, hematuria, synovitis.  He has had poor oral intake for the past week. As the week progressed, he continued to have worsening generalized weakness having difficulty getting up.  As result he presented for further evaluation and treatment. In the ED, the patient was febrile up to 100.4 F.  He is tachycardic 100-110.  He is hemodynamically stable.  Oxygen saturation is 95% on room air.  WBC 10.2, hemoglobin 12.3, platelets 161.  Sodium 130, potassium 4.4, bicarbonate 22, serum creatinine 5.45.  Troponin 7.  Lactic acid 0.9.  EKG showed atrial flutter with right bundle branch block.  COVID-19 PCR is negative.  CT of the brain is negative.  Chest x-ray is negative.

## 2023-09-24 NOTE — H&P (Signed)
 History and Physical    Patient: Christopher Boyer DOB: 07/05/1970 DOA: 09/24/2023 DOS: the patient was seen and examined on 09/24/2023 PCP: [facility]  Patient coming from: Home  Chief Complaint:  Chief Complaint  Patient presents with   Headache   sinus drainage   HPI: Christopher Boyer is a 53 year old male with a history of hypertension, hyperlipidemia, atrial fibrillation, anxiety/depression, CKD stage IV, DVT, obesity presenting with generalized weakness for about a week.  The patient states that he began developing nausea on 09/19/2023.  He began developing loose stools on 09/20/2023.  He took some Imodium  and it improved.  He denied any hematochezia, melena, vomiting, frank abdominal pain.  He denies any new medications.  He denies eating any raw or undercooked foods.  He denies any recent travels. He has had some fevers and chills up to 100.1 F at home.  He denies any chest pain, shortness of breath, hemoptysis.  He has had nonproductive cough with chest congestion.  He has had a frontal headache.  Denies any neck pain or rashes.  He denies any dysuria, hematuria, synovitis.  He has had poor oral intake for the past week. As the week progressed, he continued to have worsening generalized weakness having difficulty getting up.  As result he presented for further evaluation and treatment. In the ED, the patient was febrile up to 100.4 F.  He is tachycardic 100-110.  He is hemodynamically stable.  Oxygen saturation is 95% on room air.  WBC 10.2, hemoglobin 12.3, platelets 161.  Sodium 130, potassium 4.4, bicarbonate 22, serum creatinine 5.45.  Troponin 7.  Lactic acid 0.9.  EKG showed atrial flutter with right bundle branch block.  COVID-19 PCR is negative.  CT of the brain is negative.  Chest x-ray is negative.  Review of Systems: As mentioned in the history of present illness. All other systems reviewed and are negative. Past Medical History:   Diagnosis Date   Atrial fibrillation (HCC) 08/23/2019   Chronic kidney disease (CKD), stage III (moderate) (HCC)    Gout due to renal impairment    Hypertension    Leg edema    Obesity    Past Surgical History:  Procedure Laterality Date   CARDIOVERSION N/A 02/06/2020   Procedure: CARDIOVERSION;  Surgeon: Gerard Knight, MD;  Location: [facility];  Service: Cardiovascular;  Laterality: N/A;   RENAL BIOPSY     Social History:  reports that he has never smoked. He has never used smokeless tobacco. He reports that he does not drink alcohol and does not use drugs.  Allergies  Allergen Reactions   Septra [Sulfamethoxazole-Trimethoprim] Rash    Family History  Problem Relation Age of Onset   Atrial fibrillation Mother    Valvular heart disease Mother    Atrial fibrillation Father     Prior to Admission medications   Medication Sig Start Date End Date Taking? Authorizing Provider  ALPRAZolam  (XANAX ) 0.5 MG tablet Take 1 mg by mouth at bedtime.  12/13/19   [provider]  apixaban  (ELIQUIS ) 5 MG TABS tablet Take 1 tablet (5 mg total) by mouth 2 (two) times daily. 01/06/20   Berlin Breen., NP  cetirizine (ZYRTEC) 10 MG tablet Take 10 mg by mouth daily.    [provider]  Cholecalciferol (DIALYVITE VITAMIN D 5000) 125 MCG (5000 UT) capsule Take 5,000 Units by mouth at bedtime.    [provider]  escitalopram  (LEXAPRO ) 10 MG tablet  Take 10 mg by mouth daily. Take with the 20 mg to equal 30 mg daily 11/23/19   [provider]  escitalopram  (LEXAPRO ) 20 MG tablet Take 20 mg by mouth daily. Take with 10 mg to equal 30 mg daily 12/15/19   [provider]  fenofibrate  160 MG tablet Take 160 mg by mouth at bedtime.  12/01/19   [provider]  metoprolol  tartrate (LOPRESSOR ) 100 MG tablet Take 100 mg by mouth 2 (two) times daily. 11/26/19   [provider]  omeprazole (PRILOSEC) 20 MG capsule Take 40 mg by mouth daily.      [provider]  triamcinolone ointment (KENALOG) 0.5 % Apply 1 application topically daily as needed (psoriasis).  12/02/19   [provider]    Physical Exam: Vitals:   09/24/23 0630 09/24/23 0645 09/24/23 0658 09/24/23 0730  BP: (!) 125/97 125/80  116/82  Pulse: (!) 104 100  97  Resp: (!) 22 (!) 22  20  Temp:   (!) 100.4 F (38 C)   TempSrc:   Oral   SpO2: 94% 93%  93%  Weight:      Height:       GENERAL:  A&O x 3, NAD, well developed, cooperative, follows commands HEENT: [place]/AT, No thrush, No icterus, No oral ulcers Neck:  No neck mass, No meningismus, soft, supple CV: RRR, no S3, no S4, no rub, no JVD Lungs:  CTA, no wheeze, no rhonchi, good air movement Abd: soft/NT +BS, nondistended Ext: No edema, no lymphangitis, no cyanosis, no rashes Neuro:  CN II-XII intact, strength 4/5 in RUE, RLE, strength 4/5 LUE, LLE; sensation intact bilateral; no dysmetria; babinski equivocal  Data Reviewed: Data reviewed above in the history  Assessment and Plan: SIRS - Presented fever and tachycardia - Follow-up blood cultures - Obtain UA and urine culture - Chest x-ray without infiltrates - Lactic acid 0.9 - Check PCT  Acute on chronic renal failure--CKD stage IV - Baseline creatinine 4.2-4.3 - Serum creatinine up to 5.45 - Start IV fluids - Monitor serial BMP  Persistent atrial flutter - Restart metoprolol  - Restart apixaban  - 12/23/2019 echo EF 55 to 60%, no WMA - Status post ablation 2021  Morbid obesity - BMI 47.47 - Lifestyle modification  Anxiety/depression - Continue alprazolam  - PDMP reviewed, alprazolam  0.5 mg, 90, last refill 08/18/2023  Hyperlipidemia - Continue fenofibrate    Advance Care Planning:   Code Status: Prior full  Consults: none  Family Communication: none  Severity of Illness: The appropriate patient status for this patient is INPATIENT. Inpatient status is judged to be reasonable and necessary in order to provide the required  intensity of service to ensure the patient's safety. The patient's presenting symptoms, physical exam findings, and initial radiographic and laboratory data in the context of their chronic comorbidities is felt to place them at high risk for further clinical deterioration. Furthermore, it is not anticipated that the patient will be medically stable for discharge from the hospital within 2 midnights of admission.   * I certify that at the point of admission it is my clinical judgment that the patient will require inpatient hospital care spanning beyond 2 midnights from the point of admission due to high intensity of service, high risk for further deterioration and high frequency of surveillance required.*  Author: Demaris Fillers, MD 09/24/2023 8:56 AM  For on call review [email or web address].

## 2023-09-24 NOTE — Plan of Care (Signed)

## 2023-09-24 NOTE — ED Triage Notes (Signed)
 Pt with c/o headache (worse with coughing), sinus drainage, and increased stress.

## 2023-09-25 DIAGNOSIS — A419 Sepsis, unspecified organism: Secondary | ICD-10-CM | POA: Diagnosis not present

## 2023-09-25 DIAGNOSIS — E66813 Obesity, class 3: Secondary | ICD-10-CM | POA: Diagnosis not present

## 2023-09-25 DIAGNOSIS — I483 Typical atrial flutter: Secondary | ICD-10-CM

## 2023-09-25 DIAGNOSIS — N179 Acute kidney failure, unspecified: Secondary | ICD-10-CM

## 2023-09-25 DIAGNOSIS — N184 Chronic kidney disease, stage 4 (severe): Secondary | ICD-10-CM

## 2023-09-25 LAB — RENAL FUNCTION PANEL
Albumin: 2.5 g/dL — ABNORMAL LOW (ref 3.5–5.0)
Anion gap: 11 (ref 5–15)
BUN: 53 mg/dL — ABNORMAL HIGH (ref 6–20)
CO2: 20 mmol/L — ABNORMAL LOW (ref 22–32)
Calcium: 8.5 mg/dL — ABNORMAL LOW (ref 8.9–10.3)
Chloride: 102 mmol/L (ref 98–111)
Creatinine, Ser: 5.48 mg/dL — ABNORMAL HIGH (ref 0.61–1.24)
GFR, Estimated: 12 mL/min — ABNORMAL LOW (ref >60)
Glucose, Bld: 111 mg/dL — ABNORMAL HIGH (ref 70–99)
Phosphorus: 3.4 mg/dL (ref 2.5–4.6)
Potassium: 4.1 mmol/L (ref 3.5–5.1)
Sodium: 133 mmol/L — ABNORMAL LOW (ref 135–145)

## 2023-09-25 LAB — RESPIRATORY PANEL BY PCR

## 2023-09-25 LAB — CBC
HCT: 35.6 % — ABNORMAL LOW (ref 39.0–52.0)
Hemoglobin: 11.6 g/dL — ABNORMAL LOW (ref 13.0–17.0)
MCH: 30.3 pg (ref 26.0–34.0)
MCHC: 32.6 g/dL (ref 30.0–36.0)
MCV: 93 fL (ref 80.0–100.0)
Platelets: 171 10*3/uL (ref 150–400)
RBC: 3.83 MIL/uL — ABNORMAL LOW (ref 4.22–5.81)
RDW: 13.3 % (ref 11.5–15.5)
WBC: 9.4 10*3/uL (ref 4.0–10.5)
nRBC: 0 % (ref 0.0–0.2)

## 2023-09-25 MED ORDER — LOPERAMIDE HCL 2 MG PO CAPS
4.0000 mg | ORAL_CAPSULE | Freq: Once | ORAL | Status: AC
  Administered 2023-09-25: 4 mg via ORAL
  Filled 2023-09-25: qty 2

## 2023-09-25 MED ORDER — SODIUM CHLORIDE 0.9 % IV SOLN
1.0000 g | INTRAVENOUS | Status: DC
  Administered 2023-09-25 – 2023-09-26 (×2): 1 g via INTRAVENOUS
  Filled 2023-09-25 (×2): qty 10

## 2023-09-25 NOTE — Progress Notes (Signed)
 Transition of Care Department [facility]) has reviewed patient and no other TOC needs have been identified at this time. We will continue to monitor patient advancement through interdisciplinary progression rounds. If new patient transition needs arise, please place a TOC consult.   09/25/23 0840  TOC Brief Assessment  Insurance and Status Reviewed  Patient has primary care physician Yes  Home environment has been reviewed Lives with family.  Prior level of function: Independent- works full time.  Prior/Current Home Services No current home services  Social Drivers of Health Review SDOH reviewed no interventions necessary  Readmission risk has been reviewed Yes  Transition of care needs no transition of care needs at this time

## 2023-09-25 NOTE — Progress Notes (Signed)
 Progress Note   Patient: Christopher Boyer YQM:[id number] DOB: 09-12-1970 DOA: 09/24/2023     1 DOS: the patient was seen and examined on 09/25/2023   Brief hospital admission narrative: As per H&P written by Dr. Winferd Hatter On 09/24/2023 Christopher Boyer is a 53 year old male with a history of hypertension, hyperlipidemia, atrial fibrillation, anxiety/depression, CKD stage IV, DVT, obesity presenting with generalized weakness for about a week.  The patient states that he began developing nausea on 09/19/2023.  He began developing loose stools on 09/20/2023.  He took some Imodium  and it improved.  He denied any hematochezia, melena, vomiting, frank abdominal pain.  He denies any new medications.  He denies eating any raw or undercooked foods.  He denies any recent travels. He has had some fevers and chills up to 100.1 F at home.  He denies any chest pain, shortness of breath, hemoptysis.  He has had nonproductive cough with chest congestion.  He has had a frontal headache.  Denies any neck pain or rashes.  He denies any dysuria, hematuria, synovitis.  He has had poor oral intake for the past week. As the week progressed, he continued to have worsening generalized weakness having difficulty getting up.  As result he presented for further evaluation and treatment. In the ED, the patient was febrile up to 100.4 F.  He is tachycardic 100-110.  He is hemodynamically stable.  Oxygen saturation is 95% on room air.  WBC 10.2, hemoglobin 12.3, platelets 161.  Sodium 130, potassium 4.4, bicarbonate 22, serum creatinine 5.45.  Troponin 7.  Lactic acid 0.9.  EKG showed atrial flutter with right bundle branch block.  COVID-19 PCR is negative.  CT of the brain is negative.  Chest x-ray is negative.  Assessment and plan 1-SIRS/early sepsis/UTI -Patient's presented with fever and tachycardia - Lactic acid 0.9 at time of admission - Presumed source of infection UTI - Follow culture results - Continue current IV  antibiotics - Maintain adequate hydration and follow clinical response.  2-acute on chronic renal failure - Chronic kidney disease stage IV at baseline - Continue to maintain adequate hydration - Minimize nephrotoxic agents - Follow renal function trend - Continue treatment for UTI.  3-morbid obesity -Body mass index is 47.47 kg/m.  -Low-calorie diet, portion control and increase physical activity discussed with patient.  4-persistent atrial flutter - Continue metoprolol  and the use of apixaban  for secondary prevention - 2D echo demonstrating preserved ejection fraction (55-60%) no wall motion abnormality.  5-anxiety/depression - Flat affect on exam - No suicidal ideation or hallucination - Continue treatment with alprazolam   6-dyslipidemia - Continue treatment with fenofibrate .   Subjective:  Low-grade temperature overnight; currently afebrile.  No chest pain, no nausea, no vomiting, good saturation on room air.  Physical Exam: Vitals:   09/24/23 2355 09/25/23 0436 09/25/23 0622 09/25/23 1504  BP: 129/75 118/79 106/76 114/70  Pulse: 96 (!) 106 (!) 106 (!) 106  Resp:  20  (!) 24  Temp:  98.5 F (36.9 C)  99 F (37.2 C)  TempSrc:  Oral  Oral  SpO2: 99% 97% 97% 96%  Weight:      Height:       General exam: Alert, awake, oriented x 3; reports feeling slightly better. Respiratory system: No using accessory muscles; good saturation on room air. Cardiovascular system: Irregular. No rubs or gallops; unable to assess JVD with body habitus. Gastrointestinal system: Abdomen is obese, nondistended, soft and nontender. No organomegaly or masses felt. Normal bowel sounds heard. Central  nervous system: No focal neurological deficits. Extremities: No cyanosis or clubbing. Skin: No petechiae. Psychiatry: Judgement and insight appear normal. Mood & affect appropriate.    Data Reviewed: CBC: White blood cells 9.4, hemoglobin 11.6 and platelet count 171K Renal function panel:  Sodium 133, potassium 4.1, chloride 20, BUN 53, creatinine 5.48 and GFR 12  Family Communication: No family at bedside.  Disposition: Status is: Inpatient Remains inpatient appropriate because: Continue IV therapy.  Anticipating discharge back home once medically stable.  Time spent: 50 minutes  Author: Justina Oman, MD 09/25/2023 5:27 PM  For on call review [email or web address].

## 2023-09-26 DIAGNOSIS — E66813 Obesity, class 3: Secondary | ICD-10-CM | POA: Diagnosis not present

## 2023-09-26 DIAGNOSIS — N179 Acute kidney failure, unspecified: Secondary | ICD-10-CM | POA: Diagnosis not present

## 2023-09-26 DIAGNOSIS — I483 Typical atrial flutter: Secondary | ICD-10-CM | POA: Diagnosis not present

## 2023-09-26 DIAGNOSIS — A419 Sepsis, unspecified organism: Secondary | ICD-10-CM | POA: Diagnosis not present

## 2023-09-26 LAB — BASIC METABOLIC PANEL WITH GFR
Anion gap: 8 (ref 5–15)
BUN: 54 mg/dL — ABNORMAL HIGH (ref 6–20)
CO2: 21 mmol/L — ABNORMAL LOW (ref 22–32)
Calcium: 8.5 mg/dL — ABNORMAL LOW (ref 8.9–10.3)
Chloride: 99 mmol/L (ref 98–111)
Creatinine, Ser: 5.09 mg/dL — ABNORMAL HIGH (ref 0.61–1.24)
GFR, Estimated: 13 mL/min — ABNORMAL LOW (ref >60)
Glucose, Bld: 106 mg/dL — ABNORMAL HIGH (ref 70–99)
Potassium: 4.2 mmol/L (ref 3.5–5.1)
Sodium: 128 mmol/L — ABNORMAL LOW (ref 135–145)

## 2023-09-26 LAB — URINE CULTURE: Culture: >=100000 — AB

## 2023-09-26 MED ORDER — LACTATED RINGERS IV SOLN: INTRAVENOUS | Status: AC

## 2023-09-26 MED ORDER — METOPROLOL TARTRATE 50 MG PO TABS
50.0000 mg | ORAL_TABLET | Freq: Four times a day (QID) | ORAL | Status: DC
  Administered 2023-09-26 – 2023-09-27 (×5): 50 mg via ORAL
  Filled 2023-09-26 (×5): qty 1

## 2023-09-26 NOTE — Plan of Care (Signed)
   Problem: Education: Goal: Knowledge of General Education information will improve Description: Including pain rating scale, medication(s)/side effects and non-pharmacologic comfort measures Outcome: Progressing   Problem: Clinical Measurements: Goal: Ability to maintain clinical measurements within normal limits will improve Outcome: Progressing Goal: Diagnostic test results will improve Outcome: Progressing

## 2023-09-26 NOTE — Progress Notes (Signed)
 Progress Note   Patient: Christopher Boyer BJY:[id number] DOB: 1971-03-20 DOA: 09/24/2023     2 DOS: the patient was seen and examined on 09/26/2023   Brief hospital admission narrative: As per H&P written by Dr. Winferd Hatter On 09/24/2023 Christopher Boyer is a 53 year old male with a history of hypertension, hyperlipidemia, atrial fibrillation, anxiety/depression, CKD stage IV, DVT, obesity presenting with generalized weakness for about a week.  The patient states that he began developing nausea on 09/19/2023.  He began developing loose stools on 09/20/2023.  He took some Imodium  and it improved.  He denied any hematochezia, melena, vomiting, frank abdominal pain.  He denies any new medications.  He denies eating any raw or undercooked foods.  He denies any recent travels. He has had some fevers and chills up to 100.1 F at home.  He denies any chest pain, shortness of breath, hemoptysis.  He has had nonproductive cough with chest congestion.  He has had a frontal headache.  Denies any neck pain or rashes.  He denies any dysuria, hematuria, synovitis.  He has had poor oral intake for the past week. As the week progressed, he continued to have worsening generalized weakness having difficulty getting up.  As result he presented for further evaluation and treatment. In the ED, the patient was febrile up to 100.4 F.  He is tachycardic 100-110.  He is hemodynamically stable.  Oxygen saturation is 95% on room air.  WBC 10.2, hemoglobin 12.3, platelets 161.  Sodium 130, potassium 4.4, bicarbonate 22, serum creatinine 5.45.  Troponin 7.  Lactic acid 0.9.  EKG showed atrial flutter with right bundle branch block.  COVID-19 PCR is negative.  CT of the brain is negative.  Chest x-ray is negative.  Assessment and plan 1-SIRS/early sepsis/UTI -Patient's presented with fever and tachycardia - Lactic acid 0.9 at time of admission - Presumed source of infection UTI - Cultures demonstrating the presence of E. coli  microorganism; follow sensitivity. - Continue current IV antibiotics - Maintain adequate hydration and follow clinical response.  2-acute kidney injury on chronic renal failure (stage IV at baseline). - Continue to maintain adequate hydration - Minimize nephrotoxic agents - Continue to follow renal function trend - Continue treatment for UTI.  3-morbid obesity -Body mass index is 47.47 kg/m.  -Low-calorie diet, portion control and increase physical activity discussed with patient.  4-persistent atrial flutter - Continue metoprolol  (dose adjusted to home regimen) and the use of apixaban  for secondary prevention - 2D echo demonstrating preserved ejection fraction (55-60%) no wall motion abnormality.  5-anxiety/depression - Flat affect on exam; patient reported No suicidal ideation or hallucination - Continue treatment with alprazolam   6-dyslipidemia - Continue treatment with fenofibrate . - Heart healthy/low-fat diet discussed with patient.   Subjective:  Currently afebrile; no chest pain, no nausea, no vomiting.  Overall feeling better.  Physical Exam: Vitals:   09/26/23 0503 09/26/23 0714 09/26/23 1130 09/26/23 1611  BP: 111/60 119/76 119/81 122/78  Pulse: (!) 113 (!) 114 98 97  Resp: (!) 26 (!) 22 (!) 21 (!) 24  Temp: 99.2 F (37.3 C) 98.4 F (36.9 C) 98.4 F (36.9 C) 98.4 F (36.9 C)  TempSrc: Oral Oral Oral Oral  SpO2: 95% 97% 95% 99%  Weight:      Height:       General exam: Alert, awake, oriented x 3; afebrile and not requiring oxygen supplementation.  Denies chest pain. Respiratory system: Good air movement bilaterally; no using accessory muscles. Cardiovascular system: Irregular, no rubs,  no gallops, unable to assess JVD due to body habitus. Gastrointestinal system: Abdomen is obese, nondistended, soft and nontender. No organomegaly or masses felt. Normal bowel sounds heard. Central nervous system: Moving 4 limbs spontaneously.  No focal neurological  deficits. Extremities: No cyanosis or clubbing. Skin: No petechiae. Psychiatry: Judgement and insight appear normal. Mood & affect appropriate.   Latest data Reviewed: CBC: White blood cells 9.4, hemoglobin 11.6 and platelet count 171K [facility] metabolic panel: Sodium 128, potassium 4.2, chloride 99, bicarb 21, BUN 54, creatinine 5.09 and GFR 13  Family Communication: No family at bedside.  Disposition: Status is: Inpatient Remains inpatient appropriate because: Continue IV therapy.  Anticipating discharge back home once medically stable.  Time spent: 50 minutes  Author: Justina Oman, MD 09/26/2023 6:04 PM  For on call review [email or web address].

## 2023-09-27 DIAGNOSIS — A419 Sepsis, unspecified organism: Secondary | ICD-10-CM | POA: Diagnosis not present

## 2023-09-27 DIAGNOSIS — I483 Typical atrial flutter: Secondary | ICD-10-CM | POA: Diagnosis not present

## 2023-09-27 DIAGNOSIS — E66813 Obesity, class 3: Secondary | ICD-10-CM | POA: Diagnosis not present

## 2023-09-27 DIAGNOSIS — N179 Acute kidney failure, unspecified: Secondary | ICD-10-CM | POA: Diagnosis not present

## 2023-09-27 LAB — BASIC METABOLIC PANEL WITH GFR
Anion gap: 10 (ref 5–15)
BUN: 57 mg/dL — ABNORMAL HIGH (ref 6–20)
CO2: 20 mmol/L — ABNORMAL LOW (ref 22–32)
Calcium: 8.6 mg/dL — ABNORMAL LOW (ref 8.9–10.3)
Chloride: 101 mmol/L (ref 98–111)
Creatinine, Ser: 5.2 mg/dL — ABNORMAL HIGH (ref 0.61–1.24)
GFR, Estimated: 13 mL/min — ABNORMAL LOW (ref >60)
Glucose, Bld: 95 mg/dL (ref 70–99)
Potassium: 4.2 mmol/L (ref 3.5–5.1)
Sodium: 131 mmol/L — ABNORMAL LOW (ref 135–145)

## 2023-09-27 LAB — CBC
HCT: 35.4 % — ABNORMAL LOW (ref 39.0–52.0)
Hemoglobin: 11.3 g/dL — ABNORMAL LOW (ref 13.0–17.0)
MCH: 29.1 pg (ref 26.0–34.0)
MCHC: 31.9 g/dL (ref 30.0–36.0)
MCV: 91.2 fL (ref 80.0–100.0)
Platelets: 217 10*3/uL (ref 150–400)
RBC: 3.88 MIL/uL — ABNORMAL LOW (ref 4.22–5.81)
RDW: 13.5 % (ref 11.5–15.5)
WBC: 11.6 10*3/uL — ABNORMAL HIGH (ref 4.0–10.5)
nRBC: 0 % (ref 0.0–0.2)

## 2023-09-27 MED ORDER — CIPROFLOXACIN HCL 500 MG PO TABS: 500.0000 mg | ORAL_TABLET | Freq: Every day | ORAL | 0 refills | Status: AC

## 2023-09-27 NOTE — Discharge Summary (Signed)
 Physician Discharge Summary   Patient: Christopher Boyer MRN: [id number] DOB: [birth year]  Admit date:     09/24/2023  Discharge date: 09/27/23  Discharge Physician: Christopher Boyer   PCP: [facility]   Recommendations at discharge:  Repeat basic metabolic panel to follow ultralights renal function Repeat CBC to follow hemoglobin trend/WBCs stability Make sure patient follow-up with nephrology service to further assist/manage ongoing chronic renal failure. Outpatient follow-up with cardiology service as previously instructed has been encouraged.  Discharge Diagnoses: Principal Problem:   Sepsis due to E. coli UTI. Active Problems:   Acute renal failure superimposed on stage 4 chronic kidney disease (HCC)   Atrial fibrillation with RVR (HCC)   Obesity, Class III, BMI 40-49.9 (morbid obesity)   Febrile illness, acute   Brief hospital admission narrative: As per H&P written by Dr. Winferd Boyer On 09/24/2023 Christopher Boyer is a 53 year old male with a history of hypertension, hyperlipidemia, atrial fibrillation, anxiety/depression, CKD stage IV, DVT, obesity presenting with generalized weakness for about a week.  The patient states that he began developing nausea on 09/19/2023.  He began developing loose stools on 09/20/2023.  He took some Imodium  and it improved.  He denied any hematochezia, melena, vomiting, frank abdominal pain.  He denies any new medications.  He denies eating any raw or undercooked foods.  He denies any recent travels. He has had some fevers and chills up to 100.1 F at home.  He denies any chest pain, shortness of breath, hemoptysis.  He has had nonproductive cough with chest congestion.  He has had a frontal headache.  Denies any neck pain or rashes.  He denies any dysuria, hematuria, synovitis.  He has had poor oral intake for the past week. As the week progressed, he continued to have worsening generalized weakness having difficulty getting up.   As result he presented for further evaluation and treatment. In the ED, the patient was febrile up to 100.4 F.  He is tachycardic 100-110.  He is hemodynamically stable.  Oxygen saturation is 95% on room air.  WBC 10.2, hemoglobin 12.3, platelets 161.  Sodium 130, potassium 4.4, bicarbonate 22, serum creatinine 5.45.  Troponin 7.  Lactic acid 0.9.  EKG showed atrial flutter with right bundle branch block.  COVID-19 PCR is negative.  CT of the brain is negative.  Chest x-ray is negative.  Assessment and Plan: 1-SIRS/sepsis/E. coli UTI -Patient's presented with fever and tachycardia - Lactic acid 0.9 at time of admission - Presumed source of infection UTI - Cultures demonstrating the presence of E. coli microorganism; sensitive to ciprofloxacin. - Discharge home on oral antibiotics for another 5 days to continue treatment; ciprofloxacin dose adjusted to renal function. -Patient advised to maintain adequate hydration -Continue outpatient follow-up with PCP/nephrology service. - Maintain adequate hydration and follow clinical response.   2-acute kidney injury on chronic renal failure (stage IV at baseline). - Continue to maintain adequate hydration - Continue to minimize nephrotoxic agents - Continue to follow renal function trend - Continue treatment for UTI.   3-morbid obesity -Body mass index is 47.47 kg/m.  -Low-calorie diet, portion control and increase physical activity discussed with patient.   4-persistent atrial flutter - Continue metoprolol  and the use of apixaban  for secondary prevention - 2D echo demonstrating preserved ejection fraction (55-60%) no wall motion abnormality. -Continue patient follow-up with cardiology service.   5-anxiety/depression - Flat affect on exam; patient reported No suicidal ideation or hallucination - Continue treatment with alprazolam   6-dyslipidemia - Continue treatment with fenofibrate . - Heart healthy/low-fat diet discussed with  patient.  Consultants: None Procedures performed: See below for x-ray reports. Disposition: Home Diet recommendation: Heart healthy/low calorie diet.  DISCHARGE MEDICATION: Allergies as of 09/27/2023       Reactions   Septra [sulfamethoxazole-trimethoprim] Rash        Medication List     TAKE these medications    acetaminophen  500 MG tablet Commonly known as: TYLENOL  Take 1,000 mg by mouth every 6 (six) hours as needed for mild pain (pain score 1-3).   ALPRAZolam  0.5 MG tablet Commonly known as: XANAX  Take 1 mg by mouth at bedtime.   apixaban  5 MG Tabs tablet Commonly known as: ELIQUIS  Take 1 tablet (5 mg total) by mouth 2 (two) times daily.   buPROPion  300 MG 24 hr tablet Commonly known as: WELLBUTRIN  XL Take 300 mg by mouth daily.   cetirizine 10 MG tablet Commonly known as: ZYRTEC Take 10 mg by mouth daily.   ciprofloxacin 500 MG tablet Commonly known as: Cipro Take 1 tablet (500 mg total) by mouth daily for 5 days.   Dialyvite Vitamin D 5000 125 MCG (5000 UT) capsule Generic drug: Cholecalciferol Take 5,000 Units by mouth at bedtime.   DULoxetine 60 MG capsule Commonly known as: CYMBALTA Take 120 mg by mouth daily.   fenofibrate  160 MG tablet Take 160 mg by mouth at bedtime.   MAGNESIUM 27 PO Take 1 tablet by mouth at bedtime.   metoprolol  200 MG 24 hr tablet Commonly known as: TOPROL -XL Take 200 mg by mouth daily.   omeprazole 20 MG capsule Commonly known as: PRILOSEC Take 40 mg by mouth daily.   triamcinolone ointment 0.5 % Commonly known as: KENALOG Apply 1 application topically daily as needed (psoriasis).        Follow-up Information     [facility]. Schedule an appointment as soon as possible for a visit in 10 day(s).   Specialty: Family Medicine Contact information: [address] [place] [address] [phone]                Discharge Exam: Christopher Boyer Weights    09/24/23 0538  Weight: (!) 158.8 kg   General exam: Alert, awake, oriented x 3; afebrile and not requiring oxygen supplementation.  Denies chest pain. Respiratory system: Good air movement bilaterally; no using accessory muscles. Cardiovascular system: Rate controlled, no rubs, no gallops, unable to assess JVD due to body habitus. Gastrointestinal system: Abdomen is obese, nondistended, soft and nontender. No organomegaly or masses felt. Normal bowel sounds heard. Central nervous system: Moving 4 limbs spontaneously.  No focal neurological deficits. Extremities: No cyanosis or clubbing. Skin: No petechiae. Psychiatry: Judgement and insight appear normal. Mood & affect appropriate.   Condition at discharge: Stable and improved.  The results of significant diagnostics from this hospitalization (including imaging, microbiology, ancillary and laboratory) are listed below for reference.   Imaging Studies: CT HEAD WO CONTRAST ( ) Result Date: 09/24/2023 CLINICAL DATA:  Headache. EXAM: CT HEAD WITHOUT CONTRAST TECHNIQUE: Contiguous axial images were obtained from the base of the skull through the vertex without intravenous contrast. RADIATION DOSE REDUCTION: This exam was performed according to the departmental dose-optimization program which includes automated exposure control, adjustment of the mA and/or kV according to patient size and/or use of iterative reconstruction technique. COMPARISON:  No comparison studies available. FINDINGS: Brain: There is no evidence for acute hemorrhage, hydrocephalus, mass lesion, or abnormal extra-axial fluid  collection. No definite CT evidence for acute infarction. Vascular: No hyperdense vessel or unexpected calcification. Skull: No evidence for fracture. No worrisome lytic or sclerotic lesion. Sinuses/Orbits: The visualized paranasal sinuses and mastoid air cells are clear. Visualized portions of the globes and intraorbital fat are unremarkable. Other: None.  IMPRESSION: No acute intracranial abnormality. Electronically Signed   By: Donnal Fusi M.D.   On: 09/24/2023 07:24   DG Chest 2 View Result Date: 09/24/2023 CLINICAL DATA:  Cough, headache and sinus drainage. History hypertension atrial fibrillation. EXAM: CHEST - 2 VIEW COMPARISON:  Portable chest 12/22/2019 FINDINGS: The heart size and mediastinal contours are within normal limits. Both lungs are clear. The visualized skeletal structures are unremarkable apart from thoracic kyphosis and spondylosis. IMPRESSION: No evidence of acute chest disease. Thoracic kyphosis and spondylosis. Compare: Prior left lung base infiltrate resolved. Electronically Signed   By: Denman Fischer M.D.   On: 09/24/2023 06:59    Microbiology: Results for orders placed or performed during the hospital encounter of 09/24/23  Resp panel by RT-PCR (RSV, Flu A&B, Covid) Anterior Nasal Swab     Status: None   Collection Time: 09/24/23  5:35 AM   Specimen: Anterior Nasal Swab  Result Value Ref Range Status   SARS Coronavirus 2 by RT PCR NEGATIVE NEGATIVE Final    Comment: (NOTE) SARS-CoV-2 target nucleic acids are NOT DETECTED.  The SARS-CoV-2 RNA is generally detectable in upper respiratory specimens during the acute phase of infection. The lowest concentration of SARS-CoV-2 viral copies this assay can detect is 138 copies/mL. A negative result does not preclude SARS-Cov-2 infection and should not be used as the sole basis for treatment or other patient management decisions. A negative result may occur with  improper specimen collection/handling, submission of specimen other than nasopharyngeal swab, presence of viral mutation(s) within the areas targeted by this assay, and inadequate number of viral copies(<138 copies/mL). A negative result must be combined with clinical observations, patient history, and epidemiological information. The expected result is Negative.  Fact Sheet for Patients:   BloggerCourse.com  Fact Sheet for Healthcare Providers:  SeriousBroker.it  This test is no t yet approved or cleared by the [place]  FDA and  has been authorized for detection and/or diagnosis of SARS-CoV-2 by FDA under an Emergency Use Authorization (EUA). This EUA will remain  in effect (meaning this test can be used) for the duration of the COVID-19 declaration under Section 564(b)(1) of the Act, 21 U.S.C.section 360bbb-3(b)(1), unless the authorization is terminated  or revoked sooner.       Influenza A by PCR NEGATIVE NEGATIVE Final   Influenza B by PCR NEGATIVE NEGATIVE Final    Comment: (NOTE) The Xpert Xpress SARS-CoV-2/FLU/RSV plus assay is intended as an aid in the diagnosis of influenza from Nasopharyngeal swab specimens and should not be used as a sole basis for treatment. Nasal washings and aspirates are unacceptable for Xpert Xpress SARS-CoV-2/FLU/RSV testing.  Fact Sheet for Patients: BloggerCourse.com  Fact Sheet for Healthcare Providers: SeriousBroker.it  This test is not yet approved or cleared by the [place]  FDA and has been authorized for detection and/or diagnosis of SARS-CoV-2 by FDA under an Emergency Use Authorization (EUA). This EUA will remain in effect (meaning this test can be used) for the duration of the COVID-19 declaration under Section 564(b)(1) of the Act, 21 U.S.C. section 360bbb-3(b)(1), unless the authorization is terminated or revoked.     Resp Syncytial Virus by PCR NEGATIVE NEGATIVE Final    Comment: (NOTE)  Fact Sheet for Patients: BloggerCourse.com  Fact Sheet for Healthcare Providers: SeriousBroker.it  This test is not yet approved or cleared by the [place]  FDA and has been authorized for detection and/or diagnosis of SARS-CoV-2 by FDA under an Emergency Use  Authorization (EUA). This EUA will remain in effect (meaning this test can be used) for the duration of the COVID-19 declaration under Section 564(b)(1) of the Act, 21 U.S.C. section 360bbb-3(b)(1), unless the authorization is terminated or revoked.  Performed at [facility], [address], [place], [place] [address]   Culture, blood (Routine X 2) w Reflex to ID Panel     Status: None (Preliminary result)   Collection Time: 09/24/23  7:15 AM   Specimen: Right Antecubital; Blood  Result Value Ref Range Status   Specimen Description   Final    RIGHT ANTECUBITAL BOTTLES DRAWN AEROBIC AND ANAEROBIC   Special Requests Blood Culture adequate volume  Final   Culture   Final    NO GROWTH 3 DAYS Performed at [facility], [address] [place], [place] [address]    Report Status PENDING  Incomplete  Culture, blood (Routine X 2) w Reflex to ID Panel     Status: None (Preliminary result)   Collection Time: 09/24/23  7:15 AM   Specimen: BLOOD RIGHT HAND  Result Value Ref Range Status   Specimen Description   Final    BLOOD RIGHT HAND BOTTLES DRAWN AEROBIC AND ANAEROBIC   Special Requests Blood Culture adequate volume  Final   Culture   Final    NO GROWTH 3 DAYS Performed at [facility], [address] [place], [place] [address]    Report Status PENDING  Incomplete  Urine Culture     Status: Abnormal   Collection Time: 09/24/23  3:36 PM   Specimen: Urine, Random  Result Value Ref Range Status   Specimen Description   Final    URINE, RANDOM Performed at [facility], [address], [place], [place] [address]    Special Requests   Final    NONE Reflexed from [id number] Performed at [place] [facility], [address] [place], [place] [address]    Culture >=100,000 COLONIES/mL ESCHERICHIA COLI (A)  Final   Report Status 09/26/2023 FINAL  Final   Organism ID, Bacteria ESCHERICHIA COLI (A)  Final      Susceptibility   Escherichia coli - MIC*    AMPICILLIN >=32 RESISTANT Resistant      CEFAZOLIN >=64 RESISTANT Resistant     CEFEPIME <=0.12 SENSITIVE Sensitive     CEFTRIAXONE  <=0.25 SENSITIVE Sensitive     CIPROFLOXACIN <=0.25 SENSITIVE Sensitive     GENTAMICIN <=1 SENSITIVE Sensitive     IMIPENEM <=0.25 SENSITIVE Sensitive     NITROFURANTOIN <=16 SENSITIVE Sensitive     TRIMETH/SULFA <=20 SENSITIVE Sensitive     AMPICILLIN/SULBACTAM >=32 RESISTANT Resistant     PIP/TAZO <=4 SENSITIVE Sensitive ug/mL    * >=100,000 COLONIES/mL ESCHERICHIA COLI  Respiratory (~20 pathogens) panel by PCR     Status: None   Collection Time: 09/25/23  3:35 AM   Specimen: Nasopharyngeal Swab; Respiratory  Result Value Ref Range Status   Adenovirus NOT DETECTED NOT DETECTED Final   Coronavirus 229E NOT DETECTED NOT DETECTED Final    Comment: (NOTE) The Coronavirus on the Respiratory Panel, DOES NOT test for the novel  Coronavirus (2019 nCoV)    Coronavirus HKU1 NOT DETECTED NOT DETECTED Final   Coronavirus NL63 NOT DETECTED NOT DETECTED Final   Coronavirus OC43  NOT DETECTED NOT DETECTED Final   Metapneumovirus NOT DETECTED NOT DETECTED Final   Rhinovirus / Enterovirus NOT DETECTED NOT DETECTED Final   Influenza A NOT DETECTED NOT DETECTED Final   Influenza B NOT DETECTED NOT DETECTED Final   Parainfluenza Virus 1 NOT DETECTED NOT DETECTED Final   Parainfluenza Virus 2 NOT DETECTED NOT DETECTED Final   Parainfluenza Virus 3 NOT DETECTED NOT DETECTED Final   Parainfluenza Virus 4 NOT DETECTED NOT DETECTED Final   Respiratory Syncytial Virus NOT DETECTED NOT DETECTED Final   Bordetella pertussis NOT DETECTED NOT DETECTED Final   Bordetella Parapertussis NOT DETECTED NOT DETECTED Final   Chlamydophila pneumoniae NOT DETECTED NOT DETECTED Final   Mycoplasma pneumoniae NOT DETECTED NOT DETECTED Final    Comment: Performed at [facility], [address], [place], [place] [address]    Labs: CBC: Recent Labs  Lab 09/24/23 [id number] 09/25/23 0530 09/27/23 0447  WBC 10.2 9.4 11.6*   NEUTROABS 8.3*  --   --   HGB 12.3* 11.6* 11.3*  HCT 37.7* 35.6* 35.4*  MCV 91.3 93.0 91.2  PLT 161 171 217   Basic Metabolic Panel: Recent Labs  Lab 09/24/23 0634 09/25/23 0530 09/26/23 0443 09/27/23 0447  NA 130* 133* 128* 131*  K 4.4 4.1 4.2 4.2  CL 97* 102 99 101  CO2 22 20* 21* 20*  GLUCOSE 124* 111* 106* 95  BUN 50* 53* 54* 57*  CREATININE 5.45* 5.48* 5.09* 5.20*  CALCIUM 8.9 8.5* 8.5* 8.6*  PHOS  --  3.4  --   --    Liver Function Tests: Recent Labs  Lab 09/25/23 0530  ALBUMIN 2.5*   CBG: No results for input(s): "GLUCAP" in the last 168 hours.  Discharge time spent: greater than 30 minutes.  Signed: Justina Oman, MD [facility] 09/27/2023

## 2023-09-29 LAB — CULTURE, BLOOD (ROUTINE X 2)
Culture: NO GROWTH
Culture: NO GROWTH
Special Requests: ADEQUATE
Special Requests: ADEQUATE

## 2023-11-02 ENCOUNTER — Telehealth: Payer: Self-pay

## 2023-11-02 ENCOUNTER — Ambulatory Visit: Admitting: Student in an Organized Health Care Education/Training Program

## 2023-11-02 ENCOUNTER — Encounter: Payer: Self-pay | Admitting: Student in an Organized Health Care Education/Training Program

## 2023-11-02 VITALS — BP 121/66 | HR 59 | Temp 97.8°F | Resp 15 | Ht 72.0 in | Wt 350.6 lb

## 2023-11-02 DIAGNOSIS — N184 Chronic kidney disease, stage 4 (severe): Secondary | ICD-10-CM | POA: Diagnosis not present

## 2023-11-02 DIAGNOSIS — I483 Typical atrial flutter: Secondary | ICD-10-CM | POA: Diagnosis not present

## 2023-11-02 DIAGNOSIS — M109 Gout, unspecified: Secondary | ICD-10-CM

## 2023-11-02 DIAGNOSIS — F419 Anxiety disorder, unspecified: Secondary | ICD-10-CM | POA: Diagnosis not present

## 2023-11-02 DIAGNOSIS — D6859 Other primary thrombophilia: Secondary | ICD-10-CM

## 2023-11-02 DIAGNOSIS — E785 Hyperlipidemia, unspecified: Secondary | ICD-10-CM

## 2023-11-02 DIAGNOSIS — E66813 Obesity, class 3: Secondary | ICD-10-CM | POA: Diagnosis not present

## 2023-11-02 DIAGNOSIS — K439 Ventral hernia without obstruction or gangrene: Secondary | ICD-10-CM

## 2023-11-02 LAB — LIPID PANEL
Cholesterol: 160 mg/dL (ref 0–200)
HDL: 44.2 mg/dL (ref >39.00)
LDL Cholesterol: 91 mg/dL (ref 0–99)
NonHDL: 115.61
Total CHOL/HDL Ratio: 4
Triglycerides: 122 mg/dL (ref 0.0–149.0)
VLDL: 24.4 mg/dL (ref 0.0–40.0)

## 2023-11-02 LAB — COMPREHENSIVE METABOLIC PANEL WITH GFR
ALT: 21 U/L (ref 0–53)
AST: 20 U/L (ref 0–37)
Albumin: 4.2 g/dL (ref 3.5–5.2)
Alkaline Phosphatase: 49 U/L (ref 39–117)
BUN: 50 mg/dL — ABNORMAL HIGH (ref 6–23)
CO2: 29 meq/L (ref 19–32)
Calcium: 9.5 mg/dL (ref 8.4–10.5)
Chloride: 105 meq/L (ref 96–112)
Creatinine, Ser: 4.98 mg/dL (ref 0.40–1.50)
GFR: 12.73 mL/min — CL (ref >60.00)
Glucose, Bld: 93 mg/dL (ref 70–99)
Potassium: 5 meq/L (ref 3.5–5.1)
Sodium: 140 meq/L (ref 135–145)
Total Bilirubin: 0.7 mg/dL (ref 0.2–1.2)
Total Protein: 6.4 g/dL (ref 6.0–8.3)

## 2023-11-02 LAB — CBC
HCT: 39.5 % (ref 39.0–52.0)
Hemoglobin: 12.9 g/dL — ABNORMAL LOW (ref 13.0–17.0)
MCHC: 32.7 g/dL (ref 30.0–36.0)
MCV: 91.9 fl (ref 78.0–100.0)
Platelets: 235 K/uL (ref 150.0–400.0)
RBC: 4.3 Mil/uL (ref 4.22–5.81)
RDW: 14.9 % (ref 11.5–15.5)
WBC: 5.4 K/uL (ref 4.0–10.5)

## 2023-11-02 LAB — IBC + FERRITIN
Ferritin: 58.1 ng/mL (ref 22.0–322.0)
Iron: 107 ug/dL (ref 42–165)
Saturation Ratios: 22.9 % (ref 20.0–50.0)
TIBC: 467.6 ug/dL — ABNORMAL HIGH (ref 250.0–450.0)
Transferrin: 334 mg/dL (ref 212.0–360.0)

## 2023-11-02 LAB — MAGNESIUM: Magnesium: 1.9 mg/dL (ref 1.5–2.5)

## 2023-11-02 LAB — VITAMIN D 25 HYDROXY (VIT D DEFICIENCY, FRACTURES): VITD: >120 ng/mL

## 2023-11-02 LAB — PHOSPHORUS: Phosphorus: 4.4 mg/dL (ref 2.3–4.6)

## 2023-11-02 MED ORDER — DULOXETINE HCL 60 MG PO CPEP: 60.0000 mg | ORAL_CAPSULE | Freq: Every day | ORAL | Status: DC

## 2023-11-02 MED ORDER — METOPROLOL SUCCINATE ER 200 MG PO TB24: 200.0000 mg | ORAL_TABLET | Freq: Every day | ORAL | 1 refills | Status: DC

## 2023-11-02 NOTE — Patient Instructions (Signed)
  VISIT SUMMARY: Today, you had a new patient consultation to discuss your chronic kidney disease stage 4, atrial flutter, obesity, and anxiety. We reviewed your medical history, including your recent hospitalization for an E. coli infection and your current medications. We also discussed your challenges with weight management and anxiety.  YOUR PLAN: -CHRONIC KIDNEY DISEASE STAGE 4: Chronic kidney disease stage 4 means your kidneys have significantly reduced function. We will monitor your kidney function closely and have ordered several lab tests. We will also obtain your previous medical records and may adjust your medications if your kidney function declines.  -ATRIAL FLUTTER: Atrial flutter is an irregular heart rhythm. You should continue taking Eliquis  as prescribed to prevent blood clots. We will monitor your condition for any changes.  -OBESITY: Obesity means having a body mass index (BMI) of 30 or higher. We encourage you to follow dietary recommendations and weight loss strategies to improve your health.  -ANXIETY: Anxiety is a feeling of worry or fear. You are currently taking Cymbalta  and Xanax , which help manage your symptoms. We will monitor for any side effects and may adjust your medications if your kidney function declines.  -GENERAL HEALTH MAINTENANCE: You do not use tobacco or alcohol, and you have completed a colonoscopy. Continue taking vitamin D  as recommended by your nephrologist.  INSTRUCTIONS: Please schedule a follow-up appointment after your lab results are available. We will also coordinate with Dr. Constantine to obtain your previous medical records.

## 2023-11-02 NOTE — Assessment & Plan Note (Signed)
 BMI 47, weight 350 lbs. History of weight fluctuations, aware of dietary recommendations, struggles with adherence. - Encourage dietary modifications and weight loss strategies.

## 2023-11-02 NOTE — Assessment & Plan Note (Signed)
 Chronic kidney disease stage 4 for over 10 years.  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  He is being managed with Dr. Dolan at [facility]. He has discussed potential dialysis, current function sufficient to avoid. Trace bilateral leg edema. Concern over high Cymbalta  and Wellbutrin  doses due to renal impairment. - Order CBC, FE plus TIBC, lipid panel, vitamin D , comprehensive metabolic panel, PTH intact, and phosphorus levels. - Obtain records from Dr. Marilyne office. - Consider Cymbalta  and Wellbutrin  dose adjustment if renal function declines.

## 2023-11-02 NOTE — Assessment & Plan Note (Signed)
 Chronic ventral hernia on his abdomen measuring about 3 x 5 cm.  Easily reducible.  No skin changes or gangrene signs.  Sometimes causes him discomfort.  I recommended expectant management.  Because of his comorbidities, he would be at increased surgical risk for repair.  So would recommend intervention only if symptoms became significant.

## 2023-11-02 NOTE — Progress Notes (Signed)
 New Patient Office Visit  Subjective    Patient ID: Christopher Boyer, male    DOB: 1971/03/30  Age: 53 y.o. MRN: [id number]  CC:   Chief Complaint  Patient presents with   Establish Care    Pt is doing well no questions from him     HPI  Discussed the use of AI scribe software for clinical note transcription with the patient, who gave verbal consent to proceed.  History of Present Illness 53 year old male with chronic kidney disease stage 4 who presents for a new patient consultation.  He has been managing chronic kidney disease stage 4 for over ten years. A kidney biopsy was performed in 2013, but the specific etiology remains unclear to him. His kidney function is currently significantly reduced. No significant edema or other complications at this time.  In June, he was hospitalized due to an E. coli infection, which he believes originated as a urinary tract infection and progressed to bacteremia. Following this illness, he experienced significant fatigue and changes in appetite, which have since stabilized.  He has a history of atrial flutter, initially triggered during a COVID-19 infection, leading to a three to four-day hospitalization and cardioversion. He was previously managed by a cardiologist but has not had recent follow-up. 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 He experiences occasional palpitations. No chest pain during exertion, stroke, or heart attack.  He struggles with obesity, currently weighing 350 pounds with a BMI of 47. He has previously worked with a Data processing manager and acknowledges challenges in maintaining a healthy diet due to his work schedule. He reports fluctuating weight and has been heavier in the past.  He experiences anxiety, particularly in situations involving heights and driving, exacerbated by changes in his environment. He is currently taking Cymbalta  120 mg daily  and Xanax , primarily using two tablets at night to aid sleep. These medications help manage his symptoms.  He does not use tobacco or alcohol. He has a history of a ventral hernia, which causes discomfort and nausea at times, particularly when he needs to use the bathroom.    Outpatient Encounter Medications as of 11/02/2023  Medication Sig   ALPRAZolam  (XANAX ) 0.5 MG tablet Take 1 mg by mouth at bedtime.    apixaban  (ELIQUIS ) 5 MG TABS tablet Take 1 tablet (5 mg total) by mouth 2 (two) times daily.   buPROPion  (WELLBUTRIN  XL) 300 MG 24 hr tablet Take 300 mg by mouth daily.   fenofibrate  160 MG tablet Take 160 mg by mouth at bedtime.    omeprazole (PRILOSEC) 20 MG capsule Take 40 mg by mouth daily.    [DISCONTINUED] acetaminophen  (TYLENOL ) 500 MG tablet Take 1,000 mg by mouth every 6 (six) hours as needed for mild pain (pain score 1-3).   [DISCONTINUED] cetirizine (ZYRTEC) 10 MG tablet Take 10 mg by mouth daily.   [DISCONTINUED] Cholecalciferol (DIALYVITE VITAMIN D  5000) 125 MCG (5000 UT) capsule Take 5,000 Units by mouth at bedtime.   [DISCONTINUED] DULoxetine  (CYMBALTA ) 60 MG capsule Take 120 mg by mouth daily.   [DISCONTINUED] Magnesium Gluconate (MAGNESIUM 27 PO) Take 1 tablet by mouth at bedtime.   [DISCONTINUED] metoprolol  (TOPROL -XL) 200 MG 24 hr tablet Take 200 mg by mouth daily.   [DISCONTINUED] triamcinolone ointment (KENALOG) 0.5 % Apply 1 application topically daily as needed (psoriasis).    DULoxetine  (CYMBALTA ) 60 MG capsule Take 1 capsule (60 mg total)  by mouth daily.   metoprolol  (TOPROL -XL) 200 MG 24 hr tablet Take 1 tablet (200 mg total) by mouth daily.   No facility-administered encounter medications on file as of 11/02/2023.    Past Medical History:  Diagnosis Date   Atrial fibrillation (HCC) 08/23/2019   Atrial fibrillation with RVR (HCC) 12/22/2019   Chronic kidney disease (CKD), stage III (moderate) (HCC)    Gout due to renal impairment    Hypertension    Leg  edema    Obesity     Past Surgical History:  Procedure Laterality Date   CARDIOVERSION N/A 02/06/2020   Procedure: CARDIOVERSION;  Surgeon: Debera Jayson MATSU, MD;  Location: [facility];  Service: Cardiovascular;  Laterality: N/A;   RENAL BIOPSY      Family History  Problem Relation Age of Onset   Atrial fibrillation Mother    Valvular heart disease Mother    Cancer Mother    Atrial fibrillation Father    Diabetes Father         Objective    BP 121/66   Pulse (!) 59   Temp 97.8 F (36.6 C) (Temporal)   Resp 15   Ht 6' (1.829 m)   Wt (!) 350 lb 9.6 oz (159 kg)   SpO2 98%   BMI 47.55 kg/m   Physical Exam  Physical Exam Gen: Well-appearing young man Eyes: Normal, contacts in place HEENT: Right ear impacted with cerumen. Left ear clear. Tympanic membrane normal. Neck: Normal thyroid, no nodules or adenopathy Lungs: Lungs clear to auscultation, no crackles. CARDIOVASCULAR: Distant heart sounds, heart regular rate and rhythm, no murmurs. Pulse regular. ABDOMEN: Abdominal skin normal, no moles. Ventral hernia 4x5 cm. EXTREMITIES: Trace edema in both legs, right more than left. MUSCULOSKELETAL: Moderate crepitus in both knees. Psych: Appropriate mood and affect, not anxious or depressed appearing Neuro: Alert, conversational, full strength upper and lower extremities, normal gait, normal balance     Assessment & Plan:    Problem List Items Addressed This Visit       High   CKD (chronic kidney disease), stage IV (HCC) - Primary (Chronic)   Chronic kidney disease stage 4 for over 10 years.  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  He is being managed with Dr. Dolan at [facility]. He has discussed potential dialysis, current function sufficient to avoid. Trace bilateral leg edema. Concern over high Cymbalta  and Wellbutrin  doses due to renal impairment. - Order CBC, FE plus TIBC, lipid panel,  vitamin D , comprehensive metabolic panel, PTH intact, and phosphorus levels. - Obtain records from Dr. Marilyne office. - Consider Cymbalta  and Wellbutrin  dose adjustment if renal function declines.      Relevant Orders   CBC   Comprehensive metabolic panel with GFR   IBC + Ferritin   PTH, intact (no Ca)   Phosphorus   Magnesium   VITAMIN D  25 Hydroxy (Vit-D Deficiency, Fractures)   Anxiety (Chronic)   Anxiety related to heights and driving. On Cymbalta  120 mg and Xanax , effective. Concern over high Cymbalta  dose due to renal function.  I will manage Xanax , currently it is improving his function, he is not having adverse side effects.  #90 tablets lasts him approximately 6-8 weeks.  Does not need a refill today.  I do think the Cymbalta  dose is too high for his degree of renal dysfunction.  He does not want to change the dose right now. If he wants me  to refill this, I would be willing to offer 60 mg daily.  I also recommended a reduced dose of bupropion , currently on 300 mg daily.  He also does not want to change to this dose.      Relevant Medications   DULoxetine  (CYMBALTA ) 60 MG capsule   Typical atrial flutter (HCC) (Chronic)   Atrial flutter seems to be mostly an issue when he has acute illness.  No issues with palpitations or chest discomfort.  On Eliquis  primary prevention of cerebral infarction, no acute symptoms.  - Continue Eliquis  as prescribed. - I refilled metoprolol  200 mg XR daily.  Tolerating this well.      Relevant Medications   metoprolol  (TOPROL -XL) 200 MG 24 hr tablet   Hypercoagulable state (HCC) (Chronic)   Patient reports a history of an unprovoked DVT in his right leg a few years ago.  I do not have records from that, I think they were with [facility].  Seems like an unprovoked event.  He is on anticoagulation with apixaban  5 mg twice daily and tolerating that well.  No adverse side effects.  Will continue indefinite anticoagulation, especially because he has  another indication due to paroxysmal atrial flutter.        Medium    Obesity, Class III, BMI 40-49.9 (morbid obesity) (Chronic)   BMI 47, weight 350 lbs. History of weight fluctuations, aware of dietary recommendations, struggles with adherence. - Encourage dietary modifications and weight loss strategies.      Hyperlipidemia (Chronic)   Relevant Medications   metoprolol  (TOPROL -XL) 200 MG 24 hr tablet   Other Relevant Orders   Lipid panel     Low   Gout, unspecified (Chronic)   Relevant Orders   Uric acid   Ventral hernia (Chronic)   Chronic ventral hernia on his abdomen measuring about 3 x 5 cm.  Easily reducible.  No skin changes or gangrene signs.  Sometimes causes him discomfort.  I recommended expectant management.  Because of his comorbidities, he would be at increased surgical risk for repair.  So would recommend intervention only if symptoms became significant.      I personally spent a total of 50 minutes in the care of the patient today including performing a medically appropriate exam/evaluation, counseling and educating, and placing orders.   Return in about 3 months (around 02/02/2024).   Cleatus Debby Specking, MD

## 2023-11-02 NOTE — Assessment & Plan Note (Signed)
 Anxiety related to heights and driving. On Cymbalta  120 mg and Xanax , effective. Concern over high Cymbalta  dose due to renal function.  I will manage Xanax , currently it is improving his function, he is not having adverse side effects.  #90 tablets lasts him approximately 6-8 weeks.  Does not need a refill today.  I do think the Cymbalta  dose is too high for his degree of renal dysfunction.  He does not want to change the dose right now. If he wants me to refill this, I would be willing to offer 60 mg daily.  I also recommended a reduced dose of bupropion , currently on 300 mg daily.  He also does not want to change to this dose.

## 2023-11-02 NOTE — Assessment & Plan Note (Signed)
 Patient reports a history of an unprovoked DVT in his right leg a few years ago.  I do not have records from that, I think they were with [facility].  Seems like an unprovoked event.  He is on anticoagulation with apixaban  5 mg twice daily and tolerating that well.  No adverse side effects.  Will continue indefinite anticoagulation, especially because he has another indication due to paroxysmal atrial flutter.

## 2023-11-02 NOTE — Assessment & Plan Note (Signed)
 Atrial flutter seems to be mostly an issue when he has acute illness.  No issues with palpitations or chest discomfort.  On Eliquis  primary prevention of cerebral infarction, no acute symptoms.  - Continue Eliquis  as prescribed. - I refilled metoprolol  200 mg XR daily.  Tolerating this well.

## 2023-11-02 NOTE — Telephone Encounter (Signed)
 Received lab phone call for critical results on this patient  Creatinine: 4.89 GFR: 12.73 Vitamin D : Higher than 120  Read back results to lab and confirmed. Sent this message to Dr. Jerrell or review.

## 2023-11-03 ENCOUNTER — Ambulatory Visit: Payer: Self-pay | Admitting: Student in an Organized Health Care Education/Training Program

## 2023-11-03 LAB — PARATHYROID HORMONE, INTACT (NO CA): PTH: 66 pg/mL (ref 16–77)

## 2023-12-01 ENCOUNTER — Other Ambulatory Visit: Payer: Self-pay | Admitting: Student in an Organized Health Care Education/Training Program

## 2023-12-01 ENCOUNTER — Ambulatory Visit: Payer: Self-pay

## 2023-12-01 ENCOUNTER — Encounter: Payer: Self-pay | Admitting: Student in an Organized Health Care Education/Training Program

## 2023-12-01 ENCOUNTER — Ambulatory Visit (INDEPENDENT_AMBULATORY_CARE_PROVIDER_SITE_OTHER): Admitting: Student in an Organized Health Care Education/Training Program

## 2023-12-01 VITALS — BP 126/79 | HR 88 | Ht 72.0 in | Wt 351.0 lb

## 2023-12-01 DIAGNOSIS — E66813 Obesity, class 3: Secondary | ICD-10-CM | POA: Diagnosis not present

## 2023-12-01 DIAGNOSIS — J029 Acute pharyngitis, unspecified: Secondary | ICD-10-CM

## 2023-12-01 DIAGNOSIS — J069 Acute upper respiratory infection, unspecified: Secondary | ICD-10-CM | POA: Insufficient documentation

## 2023-12-01 LAB — POCT RAPID STREP A (OFFICE): Rapid Strep A Screen: NEGATIVE

## 2023-12-01 MED ORDER — WEGOVY 0.25 MG/0.5ML ~~LOC~~ SOAJ: 0.2500 mg | SUBCUTANEOUS | 1 refills | Status: DC

## 2023-12-01 NOTE — Telephone Encounter (Signed)
 FYI patient is scheduled today at 4:20pm

## 2023-12-01 NOTE — Assessment & Plan Note (Signed)
 Chronic issue of severe obesity.  Weight today 351 pounds and BMI 47.  Last A1c was 5% in June.  Obesity is complicated by advanced CKD stage IV.  He has tried nutrition and exercise regimens for weight loss and has been unsuccessful.  He is interested in medication assisted treatment.  He has no contraindications to GLP-1 agonist.  We talked about the benefits of the injectable medications.  We talked about the risks and side effects of nausea and diarrhea.  He feels comfortable doing the injections himself.  We decided to prescribe Wegovy  0.25 mg once weekly.  Patient feels comfortable doing the injections himself.  He will follow-up with me every 4-8 weeks for a structured weight loss program.  He will continue with diet and exercise modifications to promote weight loss.  Ultimately I think weight loss would be very helpful for controlling his CKD and delaying the start of dialysis.

## 2023-12-01 NOTE — Telephone Encounter (Signed)
 FYI Only or Action Required?: FYI only for provider.  Patient was last seen in primary care on 11/02/2023 by Christopher Cleatus Ned, MD.  Called Nurse Triage reporting Cough and Sore Throat.  Symptoms began several days ago.  Interventions attempted: OTC medications: Coricidin multi symptom, ibuprofen.  Symptoms are: sore throat with redness and white patches, productive cough with yellow mucus, headache unchanged.  Triage Disposition: See PCP When Office is Open (Within 3 Days)  Patient/caregiver understands and will follow disposition?: Yes                Copied from CRM #[id number]. Topic: Clinical - Red Word Triage >> Dec 01, 2023  8:16 AM Gennette ORN wrote: Red Word that prompted transfer to Nurse Triage: Patient is calling in because he has a severe cough and sore throat. Reason for Disposition  [1] Sore throat with cough/cold symptoms AND [2] present > 5 days  Answer Assessment - Initial Assessment Questions 1. ONSET: When did the throat start hurting? (Hours or days ago)      Monday. Gradually worsening.  2. SEVERITY: How bad is the sore throat? (Scale 1-10; mild, moderate or severe)     4-5/10.  3. STREP EXPOSURE: Has there been any exposure to strep within the past week? If Yes, ask: What type of contact occurred?      No.  4.  VIRAL SYMPTOMS: Are there any symptoms of a cold, such as a runny nose, cough, hoarse voice or red eyes?      Productive cough (yellow mucus), headache, . Denies runny nose, nasal congestion, ear aches, red or itchy eyes.  5. FEVER: Do you have a fever? If Yes, ask: What is your temperature, how was it measured, and when did it start?     No.  6. PUS ON THE TONSILS: Is there pus on the tonsils in the back of your throat?     Yes, redness with white patches.  7. OTHER SYMPTOMS: Do you have any other symptoms? (e.g., difficulty breathing, headache, rash)     Denies difficulty swallowing or breathing, rash.  8.  PREGNANCY: Is there any chance you are pregnant? When was your last menstrual period?     N/A.  Protocols used: Sore Throat-A-AH

## 2023-12-01 NOTE — Progress Notes (Signed)
 Acute Office Visit  Subjective:     Patient ID: Christopher Boyer, male    DOB: 04-02-1971, 53 y.o.   MRN: [id number]  Chief Complaint  Patient presents with   Sore Throat    sore throat with redness and white patches, cough, and headaches.  Sx started Monday. Getting worse    HPI  Discussed the use of AI scribe software for clinical note transcription with the patient, who gave verbal consent to proceed.  History of Present Illness Christopher Boyer is a 53 year old male who presents with a sore throat, headache, runny nose, and cough.  He has been experiencing a sore throat, headache, runny nose, and cough since Monday, with symptoms persisting throughout the week. He took Tylenol  at 2 PM for temporary relief. He has been unable to work due to his symptoms, having left work early the previous day and not working today. He has not checked his temperature but does not believe he has had a fever. No one else in his household is sick.  He reports mild sinus pain and some drainage, though not significant. He experiences a little coughing with a small amount of sputum production. No shortness of breath is noted. He feels that he is not improving at this point.      Objective:    BP 126/79 (BP Location: Right Arm, Patient Position: Sitting, Cuff Size: Large)   Pulse 88   Ht 6' (1.829 m)   Wt (!) 351 lb (159.2 kg)   SpO2 98%   BMI 47.60 kg/m   Physical Exam  Gen: Tired appearing Ears: Right ear is obstructed by cerumen, left tympanic membrane is normal Mouth: Posterior oropharynx is moderately erythematous with a small amount of exudate Heart: Regular, no murmur Lungs: Unlabored, clear with no crackles      Assessment & Plan:   Problem List Items Addressed This Visit       Medium    Obesity, Class III, BMI 40-49.9 (morbid obesity) (Chronic)   Chronic issue of severe obesity.  Weight today 351 pounds and BMI 47.  Last A1c was 5% in June.  Obesity  is complicated by advanced CKD stage IV.  He has tried nutrition and exercise regimens for weight loss and has been unsuccessful.  He is interested in medication assisted treatment.  He has no contraindications to GLP-1 agonist.  We talked about the benefits of the injectable medications.  We talked about the risks and side effects of nausea and diarrhea.  He feels comfortable doing the injections himself.  We decided to prescribe Wegovy  0.25 mg once weekly.  Patient feels comfortable doing the injections himself.  He will follow-up with me every 4-8 weeks for a structured weight loss program.  He will continue with diet and exercise modifications to promote weight loss.  Ultimately I think weight loss would be very helpful for controlling his CKD and delaying the start of dialysis.      Relevant Medications   semaglutide -weight management (WEGOVY ) 0.25 MG/0.5ML SOAJ SQ injection     Unprioritized   URI (upper respiratory infection) - Primary   Acute issue.  Symptoms seem most consistent with a viral upper respiratory tract infection.  Mostly causing him pharyngitis type symptoms.  Rapid strep test was negative.  No signs of pneumonia, otitis media, or acute sinusitis.  I do not think antibiotics or steroids would be helpful at this point.  Recommended supportive care.  Tylenol  for discomfort, no NSAIDs given  advanced CKD.  Throat lozenges, and rest.       Meds ordered this encounter  Medications   semaglutide -weight management (WEGOVY ) 0.25 MG/0.5ML SOAJ SQ injection    Sig: Inject 0.25 mg into the skin once a week.    Dispense:  2 mL    Refill:  1    Christopher Debby Specking, MD

## 2023-12-01 NOTE — Patient Instructions (Signed)
  VISIT SUMMARY: Today, you came in with a sore throat, headache, runny nose, and cough that have been bothering you since Monday. You mentioned that these symptoms have kept you from working and that you haven't noticed any improvement. We discussed your symptoms and potential treatments.  YOUR PLAN: -ACUTE PHARYNGITIS: Acute pharyngitis is an inflammation of the throat that can cause pain, headache, runny nose, and cough. We will test you for streptococcal pharyngitis to determine if you have a bacterial infection that requires antibiotics.  -MORBID OBESITY (CLASS III): Morbid obesity is a condition where a person has a very high body mass index (BMI), which can lead to other health problems. We discussed two medications, Wegovy  and Zepbound, which are weekly injections that help reduce hunger. These medications can have side effects like nausea, vomiting, and diarrhea. We will start you on the lowest dose and check your insurance coverage for these medications.  INSTRUCTIONS: Please follow up with the test for streptococcal pharyngitis as discussed. Additionally, we will start you on the weight loss medication at the lowest dose once we confirm insurance coverage. If you experience any side effects or if your symptoms worsen, please contact our office.

## 2023-12-01 NOTE — Assessment & Plan Note (Signed)
 Acute issue.  Symptoms seem most consistent with a viral upper respiratory tract infection.  Mostly causing him pharyngitis type symptoms.  Rapid strep test was negative.  No signs of pneumonia, otitis media, or acute sinusitis.  I do not think antibiotics or steroids would be helpful at this point.  Recommended supportive care.  Tylenol  for discomfort, no NSAIDs given advanced CKD.  Throat lozenges, and rest.

## 2023-12-05 ENCOUNTER — Other Ambulatory Visit (HOSPITAL_COMMUNITY): Payer: Self-pay

## 2023-12-05 NOTE — Telephone Encounter (Signed)
 ERROR

## 2023-12-11 ENCOUNTER — Encounter: Payer: Self-pay | Admitting: Student in an Organized Health Care Education/Training Program

## 2023-12-11 ENCOUNTER — Other Ambulatory Visit: Payer: Self-pay

## 2023-12-11 DIAGNOSIS — E66813 Obesity, class 3: Secondary | ICD-10-CM

## 2023-12-11 MED ORDER — WEGOVY 0.25 MG/0.5ML ~~LOC~~ SOAJ: 0.2500 mg | SUBCUTANEOUS | 1 refills | Status: DC

## 2023-12-14 ENCOUNTER — Encounter: Payer: Self-pay | Admitting: Student in an Organized Health Care Education/Training Program

## 2023-12-14 ENCOUNTER — Ambulatory Visit (INDEPENDENT_AMBULATORY_CARE_PROVIDER_SITE_OTHER): Admitting: Student in an Organized Health Care Education/Training Program

## 2023-12-14 VITALS — BP 148/82 | HR 71 | Wt 356.0 lb

## 2023-12-14 DIAGNOSIS — H6121 Impacted cerumen, right ear: Secondary | ICD-10-CM | POA: Diagnosis not present

## 2023-12-14 DIAGNOSIS — H6691 Otitis media, unspecified, right ear: Secondary | ICD-10-CM | POA: Diagnosis not present

## 2023-12-14 DIAGNOSIS — H6690 Otitis media, unspecified, unspecified ear: Secondary | ICD-10-CM | POA: Insufficient documentation

## 2023-12-14 DIAGNOSIS — F419 Anxiety disorder, unspecified: Secondary | ICD-10-CM | POA: Diagnosis not present

## 2023-12-14 DIAGNOSIS — J029 Acute pharyngitis, unspecified: Secondary | ICD-10-CM | POA: Diagnosis not present

## 2023-12-14 DIAGNOSIS — E66813 Obesity, class 3: Secondary | ICD-10-CM

## 2023-12-14 DIAGNOSIS — H6120 Impacted cerumen, unspecified ear: Secondary | ICD-10-CM | POA: Insufficient documentation

## 2023-12-14 LAB — POCT URINE DRUG SCREEN
Methylenedioxyamphetamine: NOT DETECTED
POC Amphetamine UR: NOT DETECTED
POC BENZODIAZEPINES UR: POSITIVE — AB
POC Barbiturate UR: NOT DETECTED
POC Cocaine UR: NOT DETECTED
POC DRUG SCREEN OXIDANTS URINE: NORMAL
POC Ecstasy UR: NOT DETECTED
POC Marijuana UR: NOT DETECTED
POC Methadone UR: NOT DETECTED
POC Methamphetamine UR: NOT DETECTED
POC Opiate Ur: NOT DETECTED
POC Oxycodone UR: NOT DETECTED
POC PH URINE: NORMAL
POC PHENCYCLIDINE UR: NOT DETECTED
POC SPECIFIC GRAVITY URINE: NORMAL
POC TRICYCLICS UR: NOT DETECTED
URINE TEMPERATURE: 98 [degF] (ref 90.0–100.0)

## 2023-12-14 MED ORDER — WEGOVY 0.25 MG/0.5ML ~~LOC~~ SOAJ: 0.2500 mg | SUBCUTANEOUS | 1 refills | Status: DC

## 2023-12-14 MED ORDER — ALPRAZOLAM 0.5 MG PO TABS: 0.5000 mg | ORAL_TABLET | Freq: Two times a day (BID) | ORAL | 0 refills | Status: DC | PRN

## 2023-12-14 MED ORDER — AMOXICILLIN 500 MG PO TABS: 500.0000 mg | ORAL_TABLET | Freq: Every day | ORAL | 0 refills | Status: AC

## 2023-12-14 NOTE — Assessment & Plan Note (Signed)
 Chronic and stable.  We attempted to prescribe Wegovy .  Had a little issue at the pharmacy.  I am going to send a clarifying message to our pharmacy team and we send the Wegovy  prescription to the [facility].

## 2023-12-14 NOTE — Assessment & Plan Note (Signed)
 After the cerumen was removed, exam is consistent with acute otitis media as well as otitis externa related to the chronic cerumen impaction.  Will treat this with antibiotics.  Will prescribe amoxicillin  dose adjusted for his low GFR, only 500 mg daily.

## 2023-12-14 NOTE — Assessment & Plan Note (Signed)
 Procedure Note: Manual Removal of Impacted Cerumen Using a Curette   Indication:  Cerumen impaction causing symptoms (e.g., hearing loss, pain, tinnitus) or preventing assessment of the ear canal and tympanic membrane.  Procedure:  Explained the procedure to the patient and informed consent was obtained  Review patient history for contraindications (e.g., nonintact tympanic membrane, history of ear surgery, anatomical abnormalities).  After a position of the patient's head upright, I visualized the ear canal and cerumen using an otoscope.  I gently inserted the curette into the ear canal avoiding contact with the canal walls, and carefully scooped the cerumen, removing it in small pieces.  I reassessed the ear canal and tympanic membrane, there was no residual cerumen nor signs of trauma.  Follow-Up:   I instructed the patient to report any persistent symptoms such as pain, discharge, or hearing loss.  Schedule a follow-up appointment if necessary.

## 2023-12-14 NOTE — Progress Notes (Signed)
 Acute Office Visit  Subjective:     Patient ID: Christopher Boyer, male    DOB: 06/19/1970, 53 y.o.   MRN: [id number]  Chief Complaint  Patient presents with   Ear Pain    Patient states ear trouble of right and some SOB, Runny nose and coughing.     HPI  Discussed the use of AI scribe software for clinical note transcription with the patient, who gave verbal consent to proceed.  History of Present Illness Christopher Boyer is a 53 year old male who presents with ear trouble following a recent viral respiratory infection.  He has been experiencing a sensation of bubbling in the ear when blowing his nose, which began after a hot welding bead entered his ear a long time ago. He has been using 3% hydrogen peroxide in the ear, resulting in a significant reaction described as a 'volcano' effect.  He has had a cough and runny nose for the past two weeks, which have shown improvement over the last two days. He recalls a similar viral respiratory infection two weeks ago.       Objective:    BP (!) 148/82   Pulse 71   Wt (!) 356 lb (161.5 kg)   SpO2 98%   BMI 48.28 kg/m   Physical Exam  Gen: Well-appearing man Ears: Right ear is impacted with cerumen.  This was removed with curette and irrigation.  The ear canal is erythematous, the eardrum is erythematous with a posterior effusion, no landmarks or light reflex.      Assessment & Plan:    Problem List Items Addressed This Visit       High   Anxiety (Chronic)   Chronic and stable.  Managed with alprazolam  for many years with his prior primary care provider.  Tolerates the Xanax  well, finds good benefit with it.  Uses 0.5 mg twice daily most days.  Some days where his anxiety is worse he uses an extra tablet.  I reviewed the database which was appropriate.  We checked a urine drug screen today that was appropriate.  Will refill #90 tablets which usually lasts him about 2 months.      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  We attempted to prescribe Wegovy .  Had a little issue at the pharmacy.  I am going to send a clarifying message to our pharmacy team and [facility]      Relevant Medications   semaglutide -weight management (WEGOVY ) 0.25 MG/0.5ML SOAJ SQ injection     Unprioritized   URI (upper respiratory infection)   She is to be resolving nicely.  Cough is improved.  Does seem to have acute otitis media today, but no signs of pneumonia.      Otitis media - Primary   After the cerumen was removed, exam is consistent with acute otitis media as well as otitis externa related to the chronic cerumen impaction.  Will treat this with antibiotics.  Will prescribe amoxicillin  dose adjusted for his low GFR, only 500 mg daily.      Relevant Medications   amoxicillin  (AMOXIL ) 500 MG tablet   Cerumen impaction   Procedure Note: Manual Removal of Impacted Cerumen Using a Curette   Indication:  Cerumen impaction  causing symptoms (e.g., hearing loss, pain, tinnitus) or preventing assessment of the ear canal and tympanic membrane.  Procedure:  Explained the procedure to the patient and informed consent was obtained  Review patient history for contraindications (e.g., nonintact tympanic membrane, history of ear surgery, anatomical abnormalities).  After a position of the patient's head upright, I visualized the ear canal and cerumen using an otoscope.  I gently inserted the curette into the ear canal avoiding contact with the canal walls, and carefully scooped the cerumen, removing it in small pieces.  I reassessed the ear canal and tympanic membrane, there was no residual cerumen nor signs of trauma.  Follow-Up:  I instructed the patient to report any persistent symptoms such as pain,  discharge, or hearing loss.  Schedule a follow-up appointment if necessary.        Meds ordered this encounter  Medications   ALPRAZolam  (XANAX ) 0.5 MG tablet    Sig: Take 1 tablet (0.5 mg total) by mouth 2 (two) times daily as needed for anxiety.    Dispense:  90 tablet    Refill:  0   amoxicillin  (AMOXIL ) 500 MG tablet    Sig: Take 1 tablet (500 mg total) by mouth daily for 7 days.    Dispense:  7 tablet    Refill:  0   semaglutide -weight management (WEGOVY ) 0.25 MG/0.5ML SOAJ SQ injection    Sig: Inject 0.25 mg into the skin once a week.    Dispense:  2 mL    Refill:  1    Return if symptoms worsen or fail to improve.  Cleatus Debby Specking, MD

## 2023-12-14 NOTE — Patient Instructions (Signed)
  VISIT SUMMARY: Today, you came in because of ear trouble following a recent viral respiratory infection. You have been experiencing a bubbling sensation in your ear when blowing your nose, and you have had a cough and runny nose for the past two weeks, which are now improving.  YOUR PLAN: -IMPACTED CERUMEN, LEFT EAR: Impacted cerumen means that earwax has built up in your ear canal, causing blockage. Your left ear has impacted cerumen with redness, and the eardrum is not visible. We will perform ear irrigation to remove the earwax and then re-evaluate your ear for any possible infection.  -COUGH AND NASAL CONGESTION: Your cough and nasal congestion are symptoms of an upper respiratory infection, which is a common viral infection affecting the nose, throat, and airways. These symptoms are improving, and no further treatment is needed at this time.  INSTRUCTIONS: We will perform ear irrigation to remove the earwax in your left ear. After the procedure, we will re-evaluate your ear to check for any possible infection. If you notice any worsening symptoms or new issues, please contact our office.

## 2023-12-14 NOTE — Assessment & Plan Note (Signed)
 Chronic and stable.  Managed with alprazolam  for many years with his prior primary care provider.  Tolerates the Xanax  well, finds good benefit with it.  Uses 0.5 mg twice daily most days.  Some days where his anxiety is worse he uses an extra tablet.  I reviewed the database which was appropriate.  We checked a urine drug screen today that was appropriate.  Will refill #90 tablets which usually lasts him about 2 months.

## 2023-12-14 NOTE — Assessment & Plan Note (Signed)
 She is to be resolving nicely.  Cough is improved.  Does seem to have acute otitis media today, but no signs of pneumonia.

## 2023-12-15 ENCOUNTER — Telehealth: Payer: Self-pay

## 2023-12-15 ENCOUNTER — Other Ambulatory Visit (HOSPITAL_COMMUNITY): Payer: Self-pay

## 2023-12-15 DIAGNOSIS — E66813 Obesity, class 3: Secondary | ICD-10-CM

## 2023-12-15 MED ORDER — TIRZEPATIDE-WEIGHT MANAGEMENT 2.5 MG/0.5ML ~~LOC~~ SOLN: 2.5000 mg | SUBCUTANEOUS | 1 refills | Status: DC

## 2023-12-15 NOTE — Telephone Encounter (Signed)
 Okay.  I have prescribed the Zepbound  mild to the [facility] direct pharmacy.  He should receive a call from their representatives about setting up delivery.  He should inject 2.5 mg, or 0.5 mL, subcutaneously once weekly.  Please offer him a nurse visit if he would like training on how to do the injection for himself.

## 2023-12-15 NOTE — Telephone Encounter (Signed)
 Patient stated he is willing to try the discounted zepbound .

## 2023-12-15 NOTE — Telephone Encounter (Signed)
 Pharmacy Patient Advocate Encounter   Received notification from Physician's Office that prior authorization for Wegovy  0.25MG /0.5ML auto-injectors is required/requested.   Insurance verification completed.   The patient is insured through [facility] .   Per test claim:     -Medication is not covered.

## 2023-12-15 NOTE — Telephone Encounter (Signed)
 Wegovy  is not covered for patient and would be out of pocket cost. Any change needed for therapy plan?

## 2023-12-15 NOTE — Telephone Encounter (Signed)
 Please notify patient of the insurance denial. Only available alternative is the discounted Zepbound . He can tell me if he is interested in that option.

## 2024-01-04 ENCOUNTER — Telehealth: Payer: Self-pay

## 2024-01-04 NOTE — Telephone Encounter (Signed)
 Per willow she placed on providers desk to reivew

## 2024-01-04 NOTE — Telephone Encounter (Signed)
 [facility] form received asking for additional Dx codes for 11/28/2023 DOS, fax back to [phone] Placed in provider sign folder

## 2024-01-04 NOTE — Telephone Encounter (Signed)
 Signed and placed in MA basket.

## 2024-01-05 ENCOUNTER — Ambulatory Visit: Admitting: Student in an Organized Health Care Education/Training Program

## 2024-01-05 VITALS — BP 131/79 | HR 64 | Wt 359.0 lb

## 2024-01-05 DIAGNOSIS — F419 Anxiety disorder, unspecified: Secondary | ICD-10-CM

## 2024-01-05 DIAGNOSIS — E66813 Obesity, class 3: Secondary | ICD-10-CM

## 2024-01-05 DIAGNOSIS — Z23 Encounter for immunization: Secondary | ICD-10-CM | POA: Diagnosis not present

## 2024-01-05 NOTE — Assessment & Plan Note (Signed)
 Anxiety disorder persists with stress from business and family. Alprazolam  is effective without side effects. He experiences difficulty sleeping due to stress.. Continue Alprazolam  (Xanax ) 0.5 MG orally twice daily, along with duloxetine  and bupropion .  Follow-up in 3 months.

## 2024-01-05 NOTE — Patient Instructions (Signed)
  VISIT SUMMARY: Today, we discussed your ongoing stress and anxiety issues, primarily related to work and family concerns. We also reviewed your weight management and the effectiveness of your current medications.  YOUR PLAN: -OBESITY, CLASS III (MORBID OBESITY): Morbid obesity is a condition where a person has an excessive amount of body fat, which can lead to various health issues. We discussed that previous weight management medications were not successful due to cost and insurance issues. You do not have symptoms of sleep apnea with the use of an adjustable bed.   -ANXIETY DISORDER: An anxiety disorder is a mental health condition characterized by excessive worry and stress. Your anxiety is primarily related to work and family issues. You are currently taking Alprazolam  (Xanax ) 0.5 MG orally twice daily, which is effective without side effects. We have initiated counseling, and you have a psychiatry appointment scheduled for October. Please ensure you have enough refills for your Alprazolam .  INSTRUCTIONS: Please continue with your current medications and follow up with your psychiatry appointment in October. If you have any issues accessing Semaglutide  (Wegovy ), please let us  know.

## 2024-01-05 NOTE — Telephone Encounter (Signed)
 Faxed back.

## 2024-01-05 NOTE — Assessment & Plan Note (Addendum)
 Obesity remains a significant concern. Previous weight management medications were unsuccessful due to cost and insurance issues. No sleep apnea testing has been done, and no symptoms are reported with the use of an adjustable bed.  Effective treatment appears out of reach at this time.

## 2024-01-05 NOTE — Progress Notes (Signed)
   Established Patient Office Visit  Subjective   Patient ID: Christopher Boyer, male    DOB: Aug 19, 1970  Age: 53 y.o. MRN: [id number]  Chief Complaint  Patient presents with   Weight Check    4 week follow up for weight management     HPI  Discussed the use of AI scribe software for clinical note transcription with the patient, who gave verbal consent to proceed.  History of Present Illness Christopher Boyer is a 53 year old male who presents with stress and anxiety management issues.  He experiences ongoing stress and anxiety primarily related to work issues. A recent situation involving a business truck that was improperly designed has caused significant stress as he attempts to resolve the issue with the manufacturer. 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  He is currently taking alprazolam  (Xanax ) 0.5 mg orally twice daily for stress and anxiety, which helps to the degree it needs to, with no reported side effects. Despite this, he still experiences difficulty shutting down at night due to persistent thoughts about work and other responsibilities.  He mentions a history of ear issues, noting that after having his ear cleaned out, he felt better and did not require antibiotics, which he never picked up.  He expresses concern about his children, particularly one who became withdrawn after high school and another who is working with him but not communicating much. He worries about the long-term care of his son, Christopher Boyer.  No sleep apnea and no snoring since acquiring an adjustable bed. No side effects from his current medication for stress and anxiety.     Objective:     BP 131/79   Pulse 64   Wt (!) 359 lb (162.8 kg)   SpO2 99%   BMI 48.69 kg/m   Physical Exam  Gen: Well-appearing man Ears: Bilateral ears are clear of cerumen, tympanic membranes appear normal Neck: Normal thyroid, no  nodules or adenopathy Heart: Regular, no murmur Lungs: Unlabored, clear throughout Ext: Warm, 1+ pitting edema bilaterally Psych: Mildly depressed appearing today, not anxious appearing, organized speech and linear thoughts    Assessment & Plan:    Problem List Items Addressed This Visit       High   Anxiety (Chronic)   Anxiety disorder persists with stress from business and family. Alprazolam  is effective without side effects. He experiences difficulty sleeping due to stress.. Continue Alprazolam  (Xanax ) 0.5 MG orally twice daily, along with duloxetine  and bupropion .  Follow-up in 3 months.        Medium    Obesity, Class III, BMI 40-49.9 (morbid obesity) - Primary (Chronic)   Obesity remains a significant concern. Previous weight management medications were unsuccessful due to cost and insurance issues. No sleep apnea testing has been done, and no symptoms are reported with the use of an adjustable bed.  Effective treatment appears out of reach at this time.      Other Visit Diagnoses       Needs flu shot       Relevant Orders   Flu vaccine trivalent PF, 6mos and older(Flulaval,Afluria,Fluarix,Fluzone)       Return in about 3 months (around 04/05/2024).    Cleatus Debby Specking, MD

## 2024-02-02 ENCOUNTER — Ambulatory Visit: Admitting: Student in an Organized Health Care Education/Training Program

## 2024-02-04 ENCOUNTER — Other Ambulatory Visit: Payer: Self-pay | Admitting: Student in an Organized Health Care Education/Training Program

## 2024-02-04 DIAGNOSIS — F419 Anxiety disorder, unspecified: Secondary | ICD-10-CM

## 2024-02-05 NOTE — Telephone Encounter (Signed)
 Requested Prescriptions   Pending Prescriptions Disp Refills   ALPRAZolam  (XANAX ) 0.5 MG tablet [Pharmacy Med Name: ALPRAZOLAM  0.5 MG TABLET] 90 tablet 0    Sig: TAKE 1 TABLET BY MOUTH 2 TIMES DAILY AS NEEDED FOR ANXIETY.     Date of patient request: 02/05/24 Last office visit: 01/05/2024 Upcoming visit: 04/09/2024 Date of last refill: 12/14/23 Last refill amount: 90 days

## 2024-03-17 ENCOUNTER — Other Ambulatory Visit: Payer: Self-pay | Admitting: Student in an Organized Health Care Education/Training Program

## 2024-03-17 DIAGNOSIS — F419 Anxiety disorder, unspecified: Secondary | ICD-10-CM

## 2024-03-18 NOTE — Telephone Encounter (Signed)
 Requested Prescriptions   Pending Prescriptions Disp Refills   ALPRAZolam  (XANAX ) 0.5 MG tablet [Pharmacy Med Name: ALPRAZOLAM  0.5 MG TABLET] 90 tablet 0    Sig: TAKE 1 TABLET BY MOUTH TWICE A DAY AS NEEDED FOR ANXIETY     Date of patient request: 03/18/24 Last office visit: 01/05/2024 Upcoming visit: 04/09/2024 Date of last refill: 02/05/24 Last refill amount: 90

## 2024-03-20 NOTE — Telephone Encounter (Unsigned)
 Copied from CRM [id number]. Topic: Clinical - Prescription Issue >> Mar 20, 2024 10:16 AM Berneda FALCON wrote: Reason for CRM: [facility] for his ALPRAZolam  (XANAX ) 0.5 MG tablet  He is running out and would like a call with any updates on this refill please.  [phone] [place] [place] [facility] [address] [place] [address] Phone: [phone] Fax: [phone] Hours: Not open 24 hours

## 2024-03-20 NOTE — Telephone Encounter (Unsigned)
 Copied from CRM #[id number]. Topic: General - Other >> Mar 20, 2024  1:34 PM Berneda FALCON wrote: Reason for CRM: Patient is returning phone call to [facility]. I let him know her notes states that the pharmacy situation was sorted and he is able to pick up his medication today (per the CRM notes). Patient had no further questions and expressed gratitude.

## 2024-03-20 NOTE — Telephone Encounter (Signed)
 Called patients pharmacy, he can pick up medication today. They had he was talking it twice a day instead of three times a day. Left vm for patient

## 2024-04-09 ENCOUNTER — Telehealth: Payer: Self-pay

## 2024-04-09 ENCOUNTER — Encounter: Payer: Self-pay | Admitting: Student in an Organized Health Care Education/Training Program

## 2024-04-09 ENCOUNTER — Ambulatory Visit: Admitting: Student in an Organized Health Care Education/Training Program

## 2024-04-09 VITALS — BP 135/91 | HR 59 | Wt 358.8 lb

## 2024-04-09 DIAGNOSIS — N184 Chronic kidney disease, stage 4 (severe): Secondary | ICD-10-CM

## 2024-04-09 DIAGNOSIS — F419 Anxiety disorder, unspecified: Secondary | ICD-10-CM

## 2024-04-09 LAB — IBC + FERRITIN
Ferritin: 64.1 ng/mL (ref 22.0–322.0)
Iron: 121 ug/dL (ref 42–165)
Saturation Ratios: 23.8 % (ref 20.0–50.0)
TIBC: 508.2 ug/dL — ABNORMAL HIGH (ref 250.0–450.0)
Transferrin: 363 mg/dL — ABNORMAL HIGH (ref 212.0–360.0)

## 2024-04-09 LAB — BASIC METABOLIC PANEL WITH GFR
BUN: 56 mg/dL — ABNORMAL HIGH (ref 6–23)
CO2: 27 meq/L (ref 19–32)
Calcium: 9.5 mg/dL (ref 8.4–10.5)
Chloride: 105 meq/L (ref 96–112)
Creatinine, Ser: 4.83 mg/dL (ref 0.40–1.50)
GFR: 13.17 mL/min — CL (ref >60.00)
Glucose, Bld: 89 mg/dL (ref 70–99)
Potassium: 5.2 meq/L — ABNORMAL HIGH (ref 3.5–5.1)
Sodium: 141 meq/L (ref 135–145)

## 2024-04-09 LAB — CBC
HCT: 40.5 % (ref 39.0–52.0)
Hemoglobin: 13.4 g/dL (ref 13.0–17.0)
MCHC: 33 g/dL (ref 30.0–36.0)
MCV: 92.9 fl (ref 78.0–100.0)
Platelets: 214 K/uL (ref 150.0–400.0)
RBC: 4.36 Mil/uL (ref 4.22–5.81)
RDW: 13.8 % (ref 11.5–15.5)
WBC: 5.9 K/uL (ref 4.0–10.5)

## 2024-04-09 LAB — MICROALBUMIN / CREATININE URINE RATIO
Creatinine,U: 61.9 mg/dL
Microalb Creat Ratio: 206.2 mg/g — ABNORMAL HIGH (ref 0.0–30.0)
Microalb, Ur: 12.8 mg/dL — ABNORMAL HIGH (ref 0.7–1.9)

## 2024-04-09 LAB — VITAMIN D 25 HYDROXY (VIT D DEFICIENCY, FRACTURES): VITD: 101.56 ng/mL (ref 30.00–100.00)

## 2024-04-09 NOTE — Telephone Encounter (Signed)
 [facility] lab calling in a critical lab result.  Caller name:Shaiqua  Callback #: [facility]   Critical Result: Creatine of 4.83 and GFR of 13.17  Provider has been made aware via phone note and direct message (teams/verbal)

## 2024-04-09 NOTE — Assessment & Plan Note (Signed)
 Chronic and stable.  He appears euvolemic on exam today.  He has follow-up with his nephrologist in January.  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  Will check a Cystatin C for better GFR estimation, may need to decrease the doses of Cymbalta  and bupropion  based on that result.  Will check urine microalbumin today.  Not currently using ARB or SGLT2 inhibitors, unclear benefit given his degree of renal impairment.

## 2024-04-09 NOTE — Patient Instructions (Signed)
°  VISIT SUMMARY: Today, we discussed your mental health and medication management. You shared that your anxiety has increased due to work stress and personal losses, including the recent euthanasia of your dog. We also talked about your son's healthcare challenges and your plans to follow up with your kidney specialist.  YOUR PLAN: -ANXIETY DISORDER: Anxiety disorder is a condition characterized by excessive worry and stress. Your anxiety has been worsened by recent events. You should continue taking Alprazolam  (0.5 mg twice daily), Cymbalta , and Wellbutrin  as prescribed. No side effects were reported, and your prescription for Alprazolam  will be refilled when due.  -CHRONIC KIDNEY DISEASE, STAGE 4: Chronic kidney disease stage 4 means your kidneys are significantly damaged and not working well. You need to follow up with your nephrologist in January, and blood work has been ordered today to prepare for that appointment.  INSTRUCTIONS: Please complete your blood work before your nephrology appointment in January. Continue taking your medications as prescribed and follow up with your nephrologist as planned.

## 2024-04-09 NOTE — Progress Notes (Signed)
 Established Patient Office Visit  Patient ID: Christopher Boyer, male    DOB: January 01, 1971  Age: 53 y.o. MRN: [id number] PCP: Christopher Christopher Ned, MD  Chief Complaint  Patient presents with   Medication Management    3 month follow up     Subjective:     HPI  Discussed the use of AI scribe software for clinical note transcription with the patient, who gave verbal consent to proceed.  History of Present Illness Christopher Boyer is a 53 year old male who presents for follow-up regarding his mental health and medication management.  He has been experiencing increased anxiety, exacerbated by stressors at work and personal losses. A significant incident at work occurred, although he managed to remain calm. The stress of having his son work with him, which did not go as planned, has also contributed to his anxiety. Additionally, he recently had to euthanize his dog, the last of three dogs he lost over the past year and a half, adding to his emotional burden.  He is currently taking Xanax , typically two at night, and occasionally one during the day if work stress becomes overwhelming. No confusion or sedation from the medication, and he avoids taking it at work unless absolutely necessary. He also continues to take Cymbalta  and Wellbutrin  daily.  He discusses ongoing challenges with his son Christopher Boyer healthcare and mental health management. Christopher Boyer has been to various doctors, including a dentist and a urologist, and there have been issues with obtaining necessary referrals for further testing. He is frustrated with the delays in getting Christopher Boyer evaluated for his mental health and the complications surrounding power of attorney for medical decisions.  He has not seen his kidney specialist, Dr. Darry, recently but plans to follow up in January after completing blood work. His blood pressure was slightly elevated at a recent check. No issues with his blood thinner,  apixaban .      Objective:     BP (!) 135/91   Pulse (!) 59   Wt (!) 358 lb 12.8 oz (162.8 kg)   SpO2 100%   BMI 48.66 kg/m   Physical Exam  Gen: Well-appearing man Ears: Clear ear canals, normal tympanic membranes Heart: Distant sounds, regular, no murmur Lungs: Unlabored, clear with no crackles Ext: Warm, no edema Psych: Moderately depressed appearing, angry at times, poor eye contact    Assessment & Plan:   Problem List Items Addressed This Visit       High   CKD (chronic kidney disease), stage IV (HCC) - Primary (Chronic)   Chronic and stable.  He appears euvolemic on exam today.  He has follow-up with his nephrologist in January.  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  Will check a Cystatin C for better GFR estimation, may need to decrease the doses of Cymbalta  and bupropion  based on that result.  Will check urine microalbumin today.  Not currently using ARB or SGLT2 inhibitors, unclear benefit given his degree of renal impairment.      Relevant Orders   CBC   Cystatin C with Glomerular Filtration Rate, Estimated (eGFR)   Basic metabolic panel with GFR   IBC + Ferritin   VITAMIN D  25 Hydroxy (Vit-D Deficiency, Fractures)   Microalbumin / creatinine urine ratio   PTH, intact (no Ca)   Anxiety (Chronic)   Chronic anxiety is a little worsened than usual.  He has a number of stressors occurring at work and with his  family.  He is experiencing caregiver stress, his son is going through psychiatric evaluations right now because of failure to launch.  He is frustrated and overwhelmed at the prospect of getting a power of attorney, intellectual evaluation, and applying for disability.  Feels like he is not getting enough support from his son's behavioral health team.  I asked our social worker through [facility] to call him and talk about caregiver stress and the process ahead, I think he would benefit from coming to terms with reasonable expectations about  this difficult process.  Will continue with his medication management including Cymbalta , bupropion , and Xanax .  Currently using Xanax  0.5 mg #90 tablets lasts about 1 month.  He does not use that medication at work when he is using heavy equipment, feels safe using the medication, no side effects to it.  I reviewed the database which was appropriate.  Urine drug screen at last visit was appropriate.  Will refill this medication when it is due.  Follow-up with me in 3 months.      Relevant Orders   AMB Referral [facility] Care Management    Return in about 3 months (around 07/08/2024).    Christopher Debby Specking, MD [facility]

## 2024-04-09 NOTE — Assessment & Plan Note (Signed)
 Chronic anxiety is a little worsened than usual.  He has a number of stressors occurring at work and with his family.  He is experiencing caregiver stress, his son is going through psychiatric evaluations right now because of failure to launch.  He is frustrated and overwhelmed at the prospect of getting a power of attorney, intellectual evaluation, and applying for disability.  Feels like he is not getting enough support from his son's behavioral health team.  I asked our social worker through [facility] to call him and talk about caregiver stress and the process ahead, I think he would benefit from coming to terms with reasonable expectations about this difficult process.  Will continue with his medication management including Cymbalta , bupropion , and Xanax .  Currently using Xanax  0.5 mg #90 tablets lasts about 1 month.  He does not use that medication at work when he is using heavy equipment, feels safe using the medication, no side effects to it.  I reviewed the database which was appropriate.  Urine drug screen at last visit was appropriate.  Will refill this medication when it is due.  Follow-up with me in 3 months.

## 2024-04-09 NOTE — Telephone Encounter (Signed)
 [facility] calling in a critical lab result.  Caller name: Nila Rushing #: [phone]  Critical Result: Vitamin D  101.56  Provider has been made aware via phone note and direct message (teams/verbal)

## 2024-04-10 ENCOUNTER — Ambulatory Visit: Payer: Self-pay | Admitting: Student in an Organized Health Care Education/Training Program

## 2024-04-10 LAB — PARATHYROID HORMONE, INTACT (NO CA): PTH: 53 pg/mL (ref 16–77)

## 2024-04-11 LAB — CYSTATIN C WITH GLOMERULAR FILTRATION RATE, ESTIMATED (EGFR)
CYSTATIN C: 3.8 mg/L — ABNORMAL HIGH (ref 0.52–1.23)
eGFR: 14 mL/min/1.73m2 — ABNORMAL LOW (ref >=60)

## 2024-04-12 ENCOUNTER — Telehealth: Payer: Self-pay | Admitting: *Deleted

## 2024-04-12 NOTE — Progress Notes (Signed)
 Complex Care Management Note Care Guide Note  04/12/2024 Name: Christopher Boyer MRN: [id number] DOB: July 05, 1970   Complex Care Management Outreach Attempts: An unsuccessful telephone outreach was attempted today to offer the patient information about available complex care management services.  Follow Up Plan:  Additional outreach attempts will be made to offer the patient complex care management information and services.   Encounter Outcome:  No Answer  Thedford Franks, CMA [place]  [facility], [facility] Guide Direct Dial: [phone]  Fax: [phone] Website: [email or web address]

## 2024-04-22 NOTE — Progress Notes (Unsigned)
 Complex Care Management Note Care Guide Note  04/22/2024 Name: Christopher Boyer MRN: [id number] DOB: [birth year]   Complex Care Management Outreach Attempts: A second unsuccessful outreach was attempted today to offer the patient with information about available complex care management services.  Follow Up Plan:  Additional outreach attempts will be made to offer the patient complex care management information and services.   Encounter Outcome:  No Answer  Thedford Franks, CMA [place]  [facility], [facility] Guide Direct Dial: [phone]  Fax: [phone] Website: [email or web address]

## 2024-04-23 NOTE — Progress Notes (Signed)
 Complex Care Management Note Care Guide Note  04/23/2024 Name: Christopher Boyer MRN: [id number] DOB: [birth year]   Complex Care Management Outreach Attempts: A third unsuccessful outreach was attempted today to offer the patient with information about available complex care management services.  Follow Up Plan:  No further outreach attempts will be made at this time. We have been unable to contact the patient to offer or enroll patient in complex care management services.  Encounter Outcome:  No Answer  Thedford Franks, CMA [place]  [facility], [facility] Guide Direct Dial: [phone]  Fax: [phone] Website: [email or web address]

## 2024-04-30 ENCOUNTER — Other Ambulatory Visit: Payer: Self-pay | Admitting: Student in an Organized Health Care Education/Training Program

## 2024-04-30 ENCOUNTER — Encounter: Payer: Self-pay | Admitting: Student in an Organized Health Care Education/Training Program

## 2024-04-30 DIAGNOSIS — I483 Typical atrial flutter: Secondary | ICD-10-CM

## 2024-04-30 NOTE — Telephone Encounter (Signed)
 Okay to refill Eliquis  under your name and to [facility]

## 2024-05-01 MED ORDER — BUPROPION HCL ER (XL) 300 MG PO TB24: 300.0000 mg | ORAL_TABLET | Freq: Every day | ORAL | 3 refills | Status: AC

## 2024-05-01 MED ORDER — APIXABAN 5 MG PO TABS: 5.0000 mg | ORAL_TABLET | Freq: Two times a day (BID) | ORAL | 6 refills | Status: AC

## 2024-05-01 MED ORDER — DULOXETINE HCL 60 MG PO CPEP: 60.0000 mg | ORAL_CAPSULE | Freq: Every day | ORAL | 3 refills | Status: AC

## 2024-05-01 MED ORDER — FENOFIBRATE 160 MG PO TABS: 160.0000 mg | ORAL_TABLET | Freq: Every day | ORAL | 3 refills | Status: AC

## 2024-05-01 MED ORDER — OMEPRAZOLE 20 MG PO CPDR: 40.0000 mg | DELAYED_RELEASE_CAPSULE | Freq: Every day | ORAL | 1 refills | Status: AC

## 2024-05-01 MED ORDER — METOPROLOL SUCCINATE ER 200 MG PO TB24: 200.0000 mg | ORAL_TABLET | Freq: Every day | ORAL | 3 refills | Status: AC

## 2024-05-09 ENCOUNTER — Other Ambulatory Visit: Payer: Self-pay | Admitting: Student in an Organized Health Care Education/Training Program

## 2024-05-09 DIAGNOSIS — F419 Anxiety disorder, unspecified: Secondary | ICD-10-CM

## 2024-05-09 NOTE — Telephone Encounter (Signed)
 Requested Prescriptions   Pending Prescriptions Disp Refills   ALPRAZolam  (XANAX ) 0.5 MG tablet [Pharmacy Med Name: ALPRAZOLAM  0.5 MG TABLET] 90 tablet 0    Sig: Take 1 tablet (0.5 mg total) by mouth 3 (three) times daily as needed for anxiety.     Date of patient request: 05/09/24 Last office visit: 04/09/2024 Upcoming visit: 07/10/2024 Date of last refill: 03/18/24 Last refill amount: 90

## 2024-07-10 ENCOUNTER — Ambulatory Visit: Admitting: Student in an Organized Health Care Education/Training Program
